# Patient Record
Sex: Female | Born: 1960 | Race: White | Hispanic: No | Marital: Married | Smoking: Never smoker
Health system: Southern US, Community
[De-identification: ages and names within clinical notes are randomized; demographics above are authoritative.]

## PROBLEM LIST (undated history)

## (undated) DIAGNOSIS — Z8041 Family history of malignant neoplasm of ovary: Secondary | ICD-10-CM

## (undated) DIAGNOSIS — T753XXA Motion sickness, initial encounter: Secondary | ICD-10-CM

## (undated) DIAGNOSIS — Z8669 Personal history of other diseases of the nervous system and sense organs: Secondary | ICD-10-CM

## (undated) DIAGNOSIS — N952 Postmenopausal atrophic vaginitis: Secondary | ICD-10-CM

## (undated) DIAGNOSIS — J45909 Unspecified asthma, uncomplicated: Secondary | ICD-10-CM

## (undated) DIAGNOSIS — N951 Menopausal and female climacteric states: Secondary | ICD-10-CM

## (undated) DIAGNOSIS — M1990 Unspecified osteoarthritis, unspecified site: Secondary | ICD-10-CM

## (undated) DIAGNOSIS — G43909 Migraine, unspecified, not intractable, without status migrainosus: Secondary | ICD-10-CM

## (undated) DIAGNOSIS — Z85828 Personal history of other malignant neoplasm of skin: Secondary | ICD-10-CM

## (undated) DIAGNOSIS — Z8489 Family history of other specified conditions: Secondary | ICD-10-CM

## (undated) DIAGNOSIS — G4700 Insomnia, unspecified: Secondary | ICD-10-CM

## (undated) HISTORY — DX: Family history of malignant neoplasm of ovary: Z80.41

## (undated) HISTORY — DX: Family history of other specified conditions: Z84.89

## (undated) HISTORY — DX: Insomnia, unspecified: G47.00

## (undated) HISTORY — DX: Postmenopausal atrophic vaginitis: N95.2

## (undated) HISTORY — DX: Menopausal and female climacteric states: N95.1

## (undated) HISTORY — DX: Personal history of other diseases of the nervous system and sense organs: Z86.69

---
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
## 2009-11-16 ENCOUNTER — Encounter: Admission: RE | Admit: 2009-11-16 | Discharge: 2009-11-16 | Payer: Self-pay | Admitting: Orthopedic Surgery

## 2009-12-09 ENCOUNTER — Encounter: Admission: RE | Admit: 2009-12-09 | Discharge: 2009-12-09 | Payer: Self-pay | Admitting: Orthopedic Surgery

## 2010-10-13 ENCOUNTER — Ambulatory Visit
Admission: RE | Admit: 2010-10-13 | Discharge: 2010-10-13 | Disposition: A | Discharge status: Home / Self Care | Payer: BC Managed Care – PPO | Source: Ambulatory Visit | Attending: Orthopedic Surgery | Admitting: Orthopedic Surgery

## 2010-10-13 ENCOUNTER — Other Ambulatory Visit: Payer: Self-pay | Admitting: Orthopedic Surgery

## 2010-10-13 DIAGNOSIS — M545 Low back pain, unspecified: Secondary | ICD-10-CM

## 2010-10-13 MED ORDER — METHYLPREDNISOLONE ACETATE 40 MG/ML INJ SUSP (RADIOLOG
120.0000 mg | Freq: Once | INTRAMUSCULAR | Status: AC
  Administered 2010-10-13: 120 mg via EPIDURAL

## 2010-10-13 MED ORDER — IOHEXOL 180 MG/ML  SOLN
1.0000 mL | Freq: Once | INTRAMUSCULAR | Status: AC | PRN
  Administered 2010-10-13: 1 mL via EPIDURAL

## 2010-11-27 ENCOUNTER — Other Ambulatory Visit: Payer: Self-pay | Admitting: Orthopedic Surgery

## 2010-11-27 DIAGNOSIS — M549 Dorsalgia, unspecified: Secondary | ICD-10-CM

## 2010-11-28 ENCOUNTER — Ambulatory Visit
Admission: RE | Admit: 2010-11-28 | Discharge: 2010-11-28 | Disposition: A | Discharge status: Home / Self Care | Payer: BC Managed Care – PPO | Source: Ambulatory Visit | Attending: Orthopedic Surgery | Admitting: Orthopedic Surgery

## 2010-11-28 DIAGNOSIS — M549 Dorsalgia, unspecified: Secondary | ICD-10-CM

## 2010-11-28 MED ORDER — IOHEXOL 180 MG/ML  SOLN
1.0000 mL | Freq: Once | INTRAMUSCULAR | Status: AC | PRN
  Administered 2010-11-28: 1 mL via EPIDURAL

## 2010-11-28 MED ORDER — METHYLPREDNISOLONE ACETATE 40 MG/ML INJ SUSP (RADIOLOG
120.0000 mg | Freq: Once | INTRAMUSCULAR | Status: AC
  Administered 2010-11-28: 120 mg via EPIDURAL

## 2011-04-03 ENCOUNTER — Other Ambulatory Visit: Payer: Self-pay | Admitting: Orthopedic Surgery

## 2011-04-03 DIAGNOSIS — M549 Dorsalgia, unspecified: Secondary | ICD-10-CM

## 2011-04-06 ENCOUNTER — Ambulatory Visit
Admission: RE | Admit: 2011-04-06 | Discharge: 2011-04-06 | Disposition: A | Discharge status: Home / Self Care | Payer: BC Managed Care – PPO | Source: Ambulatory Visit | Attending: Orthopedic Surgery | Admitting: Orthopedic Surgery

## 2011-04-06 DIAGNOSIS — M549 Dorsalgia, unspecified: Secondary | ICD-10-CM

## 2011-04-06 MED ORDER — IOHEXOL 180 MG/ML  SOLN
1.0000 mL | Freq: Once | INTRAMUSCULAR | Status: AC | PRN
  Administered 2011-04-06: 1 mL via EPIDURAL

## 2011-04-06 MED ORDER — METHYLPREDNISOLONE ACETATE 40 MG/ML INJ SUSP (RADIOLOG
120.0000 mg | Freq: Once | INTRAMUSCULAR | Status: AC
  Administered 2011-04-06: 120 mg via EPIDURAL

## 2013-12-09 ENCOUNTER — Other Ambulatory Visit: Payer: Self-pay | Admitting: Orthopedic Surgery

## 2013-12-09 DIAGNOSIS — M48061 Spinal stenosis, lumbar region without neurogenic claudication: Secondary | ICD-10-CM

## 2013-12-09 DIAGNOSIS — M79605 Pain in left leg: Secondary | ICD-10-CM

## 2013-12-09 DIAGNOSIS — M5126 Other intervertebral disc displacement, lumbar region: Secondary | ICD-10-CM

## 2013-12-18 ENCOUNTER — Ambulatory Visit
Admission: RE | Admit: 2013-12-18 | Discharge: 2013-12-18 | Disposition: A | Discharge status: Home / Self Care | Payer: BC Managed Care – PPO | Source: Ambulatory Visit | Attending: Orthopedic Surgery | Admitting: Orthopedic Surgery

## 2013-12-18 DIAGNOSIS — M48061 Spinal stenosis, lumbar region without neurogenic claudication: Secondary | ICD-10-CM

## 2013-12-18 DIAGNOSIS — M5126 Other intervertebral disc displacement, lumbar region: Secondary | ICD-10-CM

## 2013-12-18 DIAGNOSIS — M79605 Pain in left leg: Secondary | ICD-10-CM

## 2013-12-18 MED ORDER — IOHEXOL 180 MG/ML  SOLN
1.0000 mL | Freq: Once | INTRAMUSCULAR | Status: AC | PRN
  Administered 2013-12-18: 1 mL via EPIDURAL

## 2013-12-18 MED ORDER — METHYLPREDNISOLONE ACETATE 40 MG/ML INJ SUSP (RADIOLOG
120.0000 mg | Freq: Once | INTRAMUSCULAR | Status: AC
  Administered 2013-12-18: 120 mg via EPIDURAL

## 2013-12-18 NOTE — Discharge Instructions (Signed)

## 2014-01-08 HISTORY — PX: BACK SURGERY: SHX140

## 2014-03-01 ENCOUNTER — Other Ambulatory Visit: Payer: Self-pay | Admitting: Orthopedic Surgery

## 2014-03-01 DIAGNOSIS — M79605 Pain in left leg: Secondary | ICD-10-CM

## 2014-03-01 DIAGNOSIS — M5126 Other intervertebral disc displacement, lumbar region: Secondary | ICD-10-CM

## 2014-03-05 ENCOUNTER — Ambulatory Visit
Admission: RE | Admit: 2014-03-05 | Discharge: 2014-03-05 | Disposition: A | Discharge status: Home / Self Care | Payer: 59 | Source: Ambulatory Visit | Attending: Orthopedic Surgery | Admitting: Orthopedic Surgery

## 2014-03-05 DIAGNOSIS — M5126 Other intervertebral disc displacement, lumbar region: Secondary | ICD-10-CM

## 2014-03-05 DIAGNOSIS — M79605 Pain in left leg: Secondary | ICD-10-CM

## 2014-03-05 MED ORDER — IOHEXOL 180 MG/ML  SOLN
1.0000 mL | Freq: Once | INTRAMUSCULAR | Status: AC | PRN
  Administered 2014-03-05: 1 mL via EPIDURAL

## 2014-03-05 MED ORDER — METHYLPREDNISOLONE ACETATE 40 MG/ML INJ SUSP (RADIOLOG
120.0000 mg | Freq: Once | INTRAMUSCULAR | Status: AC
  Administered 2014-03-05: 120 mg via EPIDURAL

## 2014-03-17 ENCOUNTER — Inpatient Hospital Stay: Payer: Self-pay | Admitting: Surgery

## 2014-03-17 ENCOUNTER — Ambulatory Visit: Payer: Self-pay | Admitting: Physician Assistant

## 2014-04-19 ENCOUNTER — Other Ambulatory Visit: Payer: Self-pay | Admitting: Orthopedic Surgery

## 2014-04-19 DIAGNOSIS — M5126 Other intervertebral disc displacement, lumbar region: Secondary | ICD-10-CM

## 2014-04-27 ENCOUNTER — Ambulatory Visit
Admission: RE | Admit: 2014-04-27 | Discharge: 2014-04-27 | Disposition: A | Discharge status: Home / Self Care | Payer: 59 | Source: Ambulatory Visit | Attending: Orthopedic Surgery | Admitting: Orthopedic Surgery

## 2014-04-27 DIAGNOSIS — M5126 Other intervertebral disc displacement, lumbar region: Secondary | ICD-10-CM

## 2014-04-27 MED ORDER — IOHEXOL 180 MG/ML  SOLN
1.0000 mL | Freq: Once | INTRAMUSCULAR | Status: AC | PRN
  Administered 2014-04-27: 1 mL via EPIDURAL

## 2014-04-27 MED ORDER — METHYLPREDNISOLONE ACETATE 40 MG/ML INJ SUSP (RADIOLOG
120.0000 mg | Freq: Once | INTRAMUSCULAR | Status: AC
  Administered 2014-04-27: 120 mg via EPIDURAL

## 2014-04-30 ENCOUNTER — Ambulatory Visit
Admit: 2014-04-30 | Disposition: A | Discharge status: Home / Self Care | Payer: Self-pay | Attending: Gastroenterology | Admitting: Gastroenterology

## 2014-05-09 NOTE — Discharge Summary (Signed)
PATIENT NAME:  Michelle Mccarthy, Michelle Mccarthy MR#:  [id number] DATE OF BIRTH:  03-25-1960  DATE OF ADMISSION:  03/17/2014 DATE OF DISCHARGE:  03/19/2014  DISCHARGE DIAGNOSES:  1.  Acute diverticulitis.  2.  Allergic rhinitis.  3.  Degenerative disk disease.  4.  History of migraines.   DISCHARGE MEDICATIONS AS FOLLOWS: 1.  Allegra 180 p.o. daily. 2.  10 mg p.o. daily. 3.  Baclofen 10 mg p.o. t.i.d. p.r.n. 4.  Relpax 20 mg daily p.r.n. 5.  Metronidazole 500 mg p.o. q. 8 hours.  6.  Cipro 500 mg p.o. q. 12 hours. 7.  Toradol 10 mg q.i.d.   INDICATION FOR ADMISSION:  [name] is a pleasant, 54 year old female who presents with lower abdominal pain, leukocytosis, and a CT scan concerning for diverticulitis. She was admitted for management of diverticulitis.   HOSPITAL COURSE AS FOLLOWS:  [name] was admitted from clinic by Dr. Pat Patrick who examined her earlier in the day and with the above-mentioned findings she was given liquids initially on the ninth. She is also given IV pain meds and IV antibiotics leukocytosis stated that her pain is slightly improved on the 9th.  She was also IV pain medications and IV antibiotics.  Her leukocytosis stayed, but her pain was slightly improved on the 10th and on the 11th her pain was resolved. She was advanced to a regular diet and p.o. antibiotics, and she was tolerating those at time of discharge.   DISCHARGE INSTRUCTIONS ARE AS FOLLOWS: [name] is to follow up with [place] as an outpatient. She is to call or return to the ED if has increased pain, nausea, vomiting, or any diarrheal signs. She is to have a colonoscopy in approximately 6 weeks as per instructions.     ____________________________ Glena Norfolk Tarrance Januszewski, MD cal:sp D: 03/30/2014 23:30:14 ET T: 03/31/2014 09:53:25 ET JOB#: [id number]  cc: Harrell Gave A. Karson Chicas, MD, <Dictator> Floyde Parkins MD ELECTRONICALLY SIGNED 04/01/2014 19:45

## 2014-06-18 ENCOUNTER — Other Ambulatory Visit: Payer: Self-pay | Admitting: Orthopedic Surgery

## 2014-06-18 DIAGNOSIS — M5126 Other intervertebral disc displacement, lumbar region: Secondary | ICD-10-CM

## 2014-06-18 DIAGNOSIS — M48061 Spinal stenosis, lumbar region without neurogenic claudication: Secondary | ICD-10-CM

## 2014-06-18 DIAGNOSIS — M79605 Pain in left leg: Secondary | ICD-10-CM

## 2014-06-23 ENCOUNTER — Ambulatory Visit
Admission: RE | Admit: 2014-06-23 | Discharge: 2014-06-23 | Disposition: A | Discharge status: Home / Self Care | Payer: 59 | Source: Ambulatory Visit | Attending: Orthopedic Surgery | Admitting: Orthopedic Surgery

## 2014-06-23 DIAGNOSIS — M79605 Pain in left leg: Secondary | ICD-10-CM

## 2014-06-23 DIAGNOSIS — M48061 Spinal stenosis, lumbar region without neurogenic claudication: Secondary | ICD-10-CM

## 2014-06-23 DIAGNOSIS — M5126 Other intervertebral disc displacement, lumbar region: Secondary | ICD-10-CM

## 2014-06-23 MED ORDER — METHYLPREDNISOLONE ACETATE 40 MG/ML INJ SUSP (RADIOLOG
120.0000 mg | Freq: Once | INTRAMUSCULAR | Status: AC
  Administered 2014-06-23: 120 mg via EPIDURAL

## 2014-06-23 MED ORDER — IOHEXOL 180 MG/ML  SOLN
1.0000 mL | Freq: Once | INTRAMUSCULAR | Status: AC | PRN
  Administered 2014-06-23: 1 mL via EPIDURAL

## 2014-08-18 ENCOUNTER — Other Ambulatory Visit: Payer: Self-pay | Admitting: Orthopedic Surgery

## 2014-08-18 DIAGNOSIS — M5416 Radiculopathy, lumbar region: Secondary | ICD-10-CM

## 2014-08-26 ENCOUNTER — Ambulatory Visit
Admission: RE | Admit: 2014-08-26 | Discharge: 2014-08-26 | Disposition: A | Discharge status: Home / Self Care | Payer: 59 | Source: Ambulatory Visit | Attending: Orthopedic Surgery | Admitting: Orthopedic Surgery

## 2014-08-26 ENCOUNTER — Other Ambulatory Visit: Payer: Self-pay | Admitting: Orthopedic Surgery

## 2014-08-26 DIAGNOSIS — M5416 Radiculopathy, lumbar region: Secondary | ICD-10-CM

## 2014-08-26 MED ORDER — METHYLPREDNISOLONE ACETATE 40 MG/ML INJ SUSP (RADIOLOG
120.0000 mg | Freq: Once | INTRAMUSCULAR | Status: AC
  Administered 2014-08-26: 120 mg via EPIDURAL

## 2014-08-26 MED ORDER — IOHEXOL 180 MG/ML  SOLN
1.0000 mL | Freq: Once | INTRAMUSCULAR | Status: DC | PRN
  Administered 2014-08-26: 1 mL via EPIDURAL

## 2014-10-19 ENCOUNTER — Other Ambulatory Visit: Payer: Self-pay | Admitting: Orthopedic Surgery

## 2014-10-19 DIAGNOSIS — IMO0002 Reserved for concepts with insufficient information to code with codable children: Secondary | ICD-10-CM

## 2014-10-19 DIAGNOSIS — M79605 Pain in left leg: Secondary | ICD-10-CM

## 2014-10-21 ENCOUNTER — Ambulatory Visit
Admission: RE | Admit: 2014-10-21 | Discharge: 2014-10-21 | Disposition: A | Discharge status: Home / Self Care | Payer: 59 | Source: Ambulatory Visit | Attending: Orthopedic Surgery | Admitting: Orthopedic Surgery

## 2014-10-21 ENCOUNTER — Other Ambulatory Visit: Payer: Self-pay | Admitting: Orthopedic Surgery

## 2014-10-21 DIAGNOSIS — IMO0002 Reserved for concepts with insufficient information to code with codable children: Secondary | ICD-10-CM

## 2014-10-21 DIAGNOSIS — M79605 Pain in left leg: Secondary | ICD-10-CM

## 2014-10-21 MED ORDER — IOHEXOL 180 MG/ML  SOLN
1.0000 mL | Freq: Once | INTRAMUSCULAR | Status: DC | PRN
  Administered 2014-10-21: 1 mL via EPIDURAL

## 2014-10-21 MED ORDER — METHYLPREDNISOLONE ACETATE 40 MG/ML INJ SUSP (RADIOLOG
120.0000 mg | Freq: Once | INTRAMUSCULAR | Status: AC
  Administered 2014-10-21: 120 mg via EPIDURAL

## 2015-01-28 ENCOUNTER — Other Ambulatory Visit: Payer: Self-pay | Admitting: Neurosurgery

## 2015-01-28 DIAGNOSIS — M5416 Radiculopathy, lumbar region: Secondary | ICD-10-CM

## 2015-02-01 ENCOUNTER — Ambulatory Visit
Admission: RE | Admit: 2015-02-01 | Discharge: 2015-02-01 | Disposition: A | Discharge status: Home / Self Care | Payer: 59 | Source: Ambulatory Visit | Attending: Neurosurgery | Admitting: Neurosurgery

## 2015-02-01 DIAGNOSIS — M5416 Radiculopathy, lumbar region: Secondary | ICD-10-CM

## 2015-02-01 MED ORDER — GADOBENATE DIMEGLUMINE 529 MG/ML IV SOLN
16.0000 mL | Freq: Once | INTRAVENOUS | Status: AC | PRN
  Administered 2015-02-01: 16 mL via INTRAVENOUS

## 2015-02-02 ENCOUNTER — Other Ambulatory Visit: Payer: Self-pay

## 2015-02-03 ENCOUNTER — Other Ambulatory Visit: Payer: Self-pay | Admitting: Neurosurgery

## 2015-02-03 DIAGNOSIS — M5137 Other intervertebral disc degeneration, lumbosacral region: Secondary | ICD-10-CM

## 2015-02-10 ENCOUNTER — Other Ambulatory Visit: Payer: Self-pay

## 2015-02-11 ENCOUNTER — Ambulatory Visit
Admission: RE | Admit: 2015-02-11 | Discharge: 2015-02-11 | Disposition: A | Discharge status: Home / Self Care | Payer: 59 | Source: Ambulatory Visit | Attending: Neurosurgery | Admitting: Neurosurgery

## 2015-02-11 DIAGNOSIS — M5137 Other intervertebral disc degeneration, lumbosacral region: Secondary | ICD-10-CM

## 2015-02-11 MED ORDER — METHYLPREDNISOLONE ACETATE 40 MG/ML INJ SUSP (RADIOLOG
120.0000 mg | Freq: Once | INTRAMUSCULAR | Status: AC
  Administered 2015-02-11: 120 mg via INTRA_ARTICULAR

## 2015-02-11 MED ORDER — IOHEXOL 180 MG/ML  SOLN
1.0000 mL | Freq: Once | INTRAMUSCULAR | Status: AC | PRN
  Administered 2015-02-11: 1 mL via INTRA_ARTICULAR

## 2015-04-25 ENCOUNTER — Other Ambulatory Visit: Payer: Self-pay | Admitting: Neurosurgery

## 2015-04-25 DIAGNOSIS — M5137 Other intervertebral disc degeneration, lumbosacral region: Secondary | ICD-10-CM

## 2015-04-25 DIAGNOSIS — M5126 Other intervertebral disc displacement, lumbar region: Secondary | ICD-10-CM

## 2015-04-29 ENCOUNTER — Ambulatory Visit
Admission: RE | Admit: 2015-04-29 | Discharge: 2015-04-29 | Disposition: A | Discharge status: Home / Self Care | Payer: 59 | Source: Ambulatory Visit | Attending: Neurosurgery | Admitting: Neurosurgery

## 2015-04-29 DIAGNOSIS — M5137 Other intervertebral disc degeneration, lumbosacral region: Secondary | ICD-10-CM

## 2015-04-29 DIAGNOSIS — M5126 Other intervertebral disc displacement, lumbar region: Secondary | ICD-10-CM

## 2015-04-29 MED ORDER — METHYLPREDNISOLONE ACETATE 40 MG/ML INJ SUSP (RADIOLOG
120.0000 mg | Freq: Once | INTRAMUSCULAR | Status: AC
  Administered 2015-04-29: 120 mg via EPIDURAL

## 2015-04-29 MED ORDER — IOHEXOL 180 MG/ML  SOLN
1.0000 mL | Freq: Once | INTRAMUSCULAR | Status: AC | PRN
Start: 2015-04-29 — End: 2015-04-29
  Administered 2015-04-29: 1 mL via EPIDURAL

## 2015-06-09 ENCOUNTER — Other Ambulatory Visit: Payer: Self-pay | Admitting: Neurosurgery

## 2015-06-09 DIAGNOSIS — M5137 Other intervertebral disc degeneration, lumbosacral region: Secondary | ICD-10-CM

## 2015-06-24 ENCOUNTER — Ambulatory Visit
Admission: RE | Admit: 2015-06-24 | Discharge: 2015-06-24 | Disposition: A | Discharge status: Home / Self Care | Payer: 59 | Source: Ambulatory Visit | Attending: Neurosurgery | Admitting: Neurosurgery

## 2015-06-24 DIAGNOSIS — M5137 Other intervertebral disc degeneration, lumbosacral region: Secondary | ICD-10-CM

## 2015-06-24 MED ORDER — IOPAMIDOL (ISOVUE-M 200) INJECTION 41%
1.0000 mL | Freq: Once | INTRAMUSCULAR | Status: AC
  Administered 2015-06-24: 1 mL via EPIDURAL

## 2015-06-24 MED ORDER — METHYLPREDNISOLONE ACETATE 40 MG/ML INJ SUSP (RADIOLOG
120.0000 mg | Freq: Once | INTRAMUSCULAR | Status: AC
Start: 2015-06-24 — End: 2015-06-24
  Administered 2015-06-24: 120 mg via EPIDURAL

## 2015-08-19 ENCOUNTER — Other Ambulatory Visit: Payer: Self-pay | Admitting: Neurosurgery

## 2015-08-19 DIAGNOSIS — M5126 Other intervertebral disc displacement, lumbar region: Secondary | ICD-10-CM

## 2015-09-08 ENCOUNTER — Ambulatory Visit
Admission: RE | Admit: 2015-09-08 | Discharge: 2015-09-08 | Disposition: A | Discharge status: Home / Self Care | Payer: 59 | Source: Ambulatory Visit | Attending: Neurosurgery | Admitting: Neurosurgery

## 2015-09-08 DIAGNOSIS — M5126 Other intervertebral disc displacement, lumbar region: Secondary | ICD-10-CM

## 2015-09-08 MED ORDER — METHYLPREDNISOLONE ACETATE 40 MG/ML INJ SUSP (RADIOLOG
120.0000 mg | Freq: Once | INTRAMUSCULAR | Status: AC
  Administered 2015-09-08: 120 mg via EPIDURAL

## 2015-09-08 MED ORDER — IOPAMIDOL (ISOVUE-M 200) INJECTION 41%
1.0000 mL | Freq: Once | INTRAMUSCULAR | Status: AC
Start: 2015-09-08 — End: 2015-09-08
  Administered 2015-09-08: 1 mL via EPIDURAL

## 2015-11-16 ENCOUNTER — Other Ambulatory Visit: Payer: Self-pay | Admitting: Obstetrics & Gynecology

## 2015-11-16 DIAGNOSIS — Z1231 Encounter for screening mammogram for malignant neoplasm of breast: Secondary | ICD-10-CM

## 2015-11-17 ENCOUNTER — Other Ambulatory Visit: Payer: Self-pay | Admitting: Neurosurgery

## 2015-11-17 DIAGNOSIS — M5126 Other intervertebral disc displacement, lumbar region: Secondary | ICD-10-CM

## 2015-11-28 ENCOUNTER — Ambulatory Visit
Admission: RE | Admit: 2015-11-28 | Discharge: 2015-11-28 | Disposition: A | Discharge status: Home / Self Care | Payer: 59 | Source: Ambulatory Visit | Attending: Neurosurgery | Admitting: Neurosurgery

## 2015-11-28 DIAGNOSIS — M5126 Other intervertebral disc displacement, lumbar region: Secondary | ICD-10-CM

## 2015-11-28 MED ORDER — IOPAMIDOL (ISOVUE-M 200) INJECTION 41%
1.0000 mL | Freq: Once | INTRAMUSCULAR | Status: AC
  Administered 2015-11-28: 1 mL via EPIDURAL

## 2015-11-28 MED ORDER — METHYLPREDNISOLONE ACETATE 40 MG/ML INJ SUSP (RADIOLOG
120.0000 mg | Freq: Once | INTRAMUSCULAR | Status: AC
  Administered 2015-11-28: 120 mg via EPIDURAL

## 2015-12-23 ENCOUNTER — Ambulatory Visit
Admission: RE | Admit: 2015-12-23 | Discharge: 2015-12-23 | Disposition: A | Discharge status: Home / Self Care | Payer: 59 | Source: Ambulatory Visit | Attending: Obstetrics & Gynecology | Admitting: Obstetrics & Gynecology

## 2015-12-23 DIAGNOSIS — Z1231 Encounter for screening mammogram for malignant neoplasm of breast: Secondary | ICD-10-CM | POA: Diagnosis present

## 2016-01-06 ENCOUNTER — Ambulatory Visit
Admission: RE | Admit: 2016-01-06 | Discharge: 2016-01-06 | Disposition: A | Discharge status: Home / Self Care | Payer: 59 | Source: Ambulatory Visit | Attending: Allergy and Immunology | Admitting: Allergy and Immunology

## 2016-01-06 ENCOUNTER — Other Ambulatory Visit: Payer: Self-pay | Admitting: Allergy and Immunology

## 2016-01-06 DIAGNOSIS — R059 Cough, unspecified: Secondary | ICD-10-CM

## 2016-01-06 DIAGNOSIS — R05 Cough: Secondary | ICD-10-CM

## 2016-01-12 DIAGNOSIS — J3081 Allergic rhinitis due to animal (cat) (dog) hair and dander: Secondary | ICD-10-CM | POA: Diagnosis not present

## 2016-01-12 DIAGNOSIS — J3089 Other allergic rhinitis: Secondary | ICD-10-CM | POA: Diagnosis not present

## 2016-01-12 DIAGNOSIS — J301 Allergic rhinitis due to pollen: Secondary | ICD-10-CM | POA: Diagnosis not present

## 2016-01-20 DIAGNOSIS — J3089 Other allergic rhinitis: Secondary | ICD-10-CM | POA: Diagnosis not present

## 2016-01-20 DIAGNOSIS — J301 Allergic rhinitis due to pollen: Secondary | ICD-10-CM | POA: Diagnosis not present

## 2016-01-20 DIAGNOSIS — J3081 Allergic rhinitis due to animal (cat) (dog) hair and dander: Secondary | ICD-10-CM | POA: Diagnosis not present

## 2016-02-08 DIAGNOSIS — G43719 Chronic migraine without aura, intractable, without status migrainosus: Secondary | ICD-10-CM | POA: Diagnosis not present

## 2016-02-09 ENCOUNTER — Other Ambulatory Visit: Payer: Self-pay | Admitting: Neurosurgery

## 2016-02-09 DIAGNOSIS — M5126 Other intervertebral disc displacement, lumbar region: Secondary | ICD-10-CM

## 2016-02-15 ENCOUNTER — Ambulatory Visit
Admission: RE | Admit: 2016-02-15 | Discharge: 2016-02-15 | Disposition: A | Discharge status: Home / Self Care | Payer: 59 | Source: Ambulatory Visit | Attending: Neurosurgery | Admitting: Neurosurgery

## 2016-02-15 ENCOUNTER — Other Ambulatory Visit: Payer: Self-pay | Admitting: Neurosurgery

## 2016-02-15 DIAGNOSIS — M5126 Other intervertebral disc displacement, lumbar region: Secondary | ICD-10-CM

## 2016-02-15 DIAGNOSIS — M47817 Spondylosis without myelopathy or radiculopathy, lumbosacral region: Secondary | ICD-10-CM | POA: Diagnosis not present

## 2016-02-15 MED ORDER — METHYLPREDNISOLONE ACETATE 40 MG/ML INJ SUSP (RADIOLOG
120.0000 mg | Freq: Once | INTRAMUSCULAR | Status: AC
  Administered 2016-02-15: 120 mg via EPIDURAL

## 2016-02-15 MED ORDER — IOPAMIDOL (ISOVUE-M 200) INJECTION 41%
1.0000 mL | Freq: Once | INTRAMUSCULAR | Status: AC
  Administered 2016-02-15: 1 mL via EPIDURAL

## 2016-02-15 NOTE — Discharge Instructions (Signed)

## 2016-02-20 DIAGNOSIS — J301 Allergic rhinitis due to pollen: Secondary | ICD-10-CM | POA: Diagnosis not present

## 2016-02-20 DIAGNOSIS — J209 Acute bronchitis, unspecified: Secondary | ICD-10-CM | POA: Diagnosis not present

## 2016-02-20 DIAGNOSIS — J4530 Mild persistent asthma, uncomplicated: Secondary | ICD-10-CM | POA: Diagnosis not present

## 2016-02-20 DIAGNOSIS — J3089 Other allergic rhinitis: Secondary | ICD-10-CM | POA: Diagnosis not present

## 2016-03-02 DIAGNOSIS — J3081 Allergic rhinitis due to animal (cat) (dog) hair and dander: Secondary | ICD-10-CM | POA: Diagnosis not present

## 2016-03-02 DIAGNOSIS — J3089 Other allergic rhinitis: Secondary | ICD-10-CM | POA: Diagnosis not present

## 2016-03-02 DIAGNOSIS — J301 Allergic rhinitis due to pollen: Secondary | ICD-10-CM | POA: Diagnosis not present

## 2016-03-07 DIAGNOSIS — N951 Menopausal and female climacteric states: Secondary | ICD-10-CM | POA: Diagnosis not present

## 2016-03-07 DIAGNOSIS — G4700 Insomnia, unspecified: Secondary | ICD-10-CM | POA: Diagnosis not present

## 2016-03-08 DIAGNOSIS — J301 Allergic rhinitis due to pollen: Secondary | ICD-10-CM | POA: Diagnosis not present

## 2016-03-08 DIAGNOSIS — J3081 Allergic rhinitis due to animal (cat) (dog) hair and dander: Secondary | ICD-10-CM | POA: Diagnosis not present

## 2016-03-08 DIAGNOSIS — J3089 Other allergic rhinitis: Secondary | ICD-10-CM | POA: Diagnosis not present

## 2016-03-22 DIAGNOSIS — J301 Allergic rhinitis due to pollen: Secondary | ICD-10-CM | POA: Diagnosis not present

## 2016-03-22 DIAGNOSIS — J3081 Allergic rhinitis due to animal (cat) (dog) hair and dander: Secondary | ICD-10-CM | POA: Diagnosis not present

## 2016-03-22 DIAGNOSIS — J3089 Other allergic rhinitis: Secondary | ICD-10-CM | POA: Diagnosis not present

## 2016-03-30 DIAGNOSIS — J3089 Other allergic rhinitis: Secondary | ICD-10-CM | POA: Diagnosis not present

## 2016-03-30 DIAGNOSIS — J301 Allergic rhinitis due to pollen: Secondary | ICD-10-CM | POA: Diagnosis not present

## 2016-03-30 DIAGNOSIS — J3081 Allergic rhinitis due to animal (cat) (dog) hair and dander: Secondary | ICD-10-CM | POA: Diagnosis not present

## 2016-04-04 DIAGNOSIS — J3089 Other allergic rhinitis: Secondary | ICD-10-CM | POA: Diagnosis not present

## 2016-04-04 DIAGNOSIS — J3081 Allergic rhinitis due to animal (cat) (dog) hair and dander: Secondary | ICD-10-CM | POA: Diagnosis not present

## 2016-04-04 DIAGNOSIS — J301 Allergic rhinitis due to pollen: Secondary | ICD-10-CM | POA: Diagnosis not present

## 2016-04-09 DIAGNOSIS — J3089 Other allergic rhinitis: Secondary | ICD-10-CM | POA: Diagnosis not present

## 2016-04-09 DIAGNOSIS — J3081 Allergic rhinitis due to animal (cat) (dog) hair and dander: Secondary | ICD-10-CM | POA: Diagnosis not present

## 2016-04-09 DIAGNOSIS — J301 Allergic rhinitis due to pollen: Secondary | ICD-10-CM | POA: Diagnosis not present

## 2016-04-12 ENCOUNTER — Other Ambulatory Visit: Payer: Self-pay | Admitting: Neurosurgery

## 2016-04-12 DIAGNOSIS — M5126 Other intervertebral disc displacement, lumbar region: Secondary | ICD-10-CM

## 2016-04-19 ENCOUNTER — Ambulatory Visit
Admission: RE | Admit: 2016-04-19 | Discharge: 2016-04-19 | Disposition: A | Discharge status: Home / Self Care | Payer: 59 | Source: Ambulatory Visit | Attending: Neurosurgery | Admitting: Neurosurgery

## 2016-04-19 ENCOUNTER — Other Ambulatory Visit: Payer: Self-pay | Admitting: Neurosurgery

## 2016-04-19 DIAGNOSIS — M5126 Other intervertebral disc displacement, lumbar region: Secondary | ICD-10-CM

## 2016-04-19 DIAGNOSIS — M47817 Spondylosis without myelopathy or radiculopathy, lumbosacral region: Secondary | ICD-10-CM | POA: Diagnosis not present

## 2016-04-19 MED ORDER — METHYLPREDNISOLONE ACETATE 40 MG/ML INJ SUSP (RADIOLOG
120.0000 mg | Freq: Once | INTRAMUSCULAR | Status: AC
  Administered 2016-04-19: 120 mg via EPIDURAL

## 2016-04-19 MED ORDER — IOPAMIDOL (ISOVUE-M 200) INJECTION 41%
1.0000 mL | Freq: Once | INTRAMUSCULAR | Status: AC
  Administered 2016-04-19: 1 mL via EPIDURAL

## 2016-04-19 NOTE — Discharge Instructions (Signed)

## 2016-04-20 DIAGNOSIS — J3081 Allergic rhinitis due to animal (cat) (dog) hair and dander: Secondary | ICD-10-CM | POA: Diagnosis not present

## 2016-04-20 DIAGNOSIS — J3089 Other allergic rhinitis: Secondary | ICD-10-CM | POA: Diagnosis not present

## 2016-04-20 DIAGNOSIS — J301 Allergic rhinitis due to pollen: Secondary | ICD-10-CM | POA: Diagnosis not present

## 2016-04-23 DIAGNOSIS — J3089 Other allergic rhinitis: Secondary | ICD-10-CM | POA: Diagnosis not present

## 2016-04-23 DIAGNOSIS — J3081 Allergic rhinitis due to animal (cat) (dog) hair and dander: Secondary | ICD-10-CM | POA: Diagnosis not present

## 2016-04-23 DIAGNOSIS — J301 Allergic rhinitis due to pollen: Secondary | ICD-10-CM | POA: Diagnosis not present

## 2016-04-24 DIAGNOSIS — G43719 Chronic migraine without aura, intractable, without status migrainosus: Secondary | ICD-10-CM | POA: Diagnosis not present

## 2016-05-03 DIAGNOSIS — J3089 Other allergic rhinitis: Secondary | ICD-10-CM | POA: Diagnosis not present

## 2016-05-03 DIAGNOSIS — J3081 Allergic rhinitis due to animal (cat) (dog) hair and dander: Secondary | ICD-10-CM | POA: Diagnosis not present

## 2016-05-03 DIAGNOSIS — J301 Allergic rhinitis due to pollen: Secondary | ICD-10-CM | POA: Diagnosis not present

## 2016-05-03 DIAGNOSIS — G43719 Chronic migraine without aura, intractable, without status migrainosus: Secondary | ICD-10-CM | POA: Diagnosis not present

## 2016-05-15 DIAGNOSIS — J3081 Allergic rhinitis due to animal (cat) (dog) hair and dander: Secondary | ICD-10-CM | POA: Diagnosis not present

## 2016-05-15 DIAGNOSIS — J301 Allergic rhinitis due to pollen: Secondary | ICD-10-CM | POA: Diagnosis not present

## 2016-05-15 DIAGNOSIS — J3089 Other allergic rhinitis: Secondary | ICD-10-CM | POA: Diagnosis not present

## 2016-05-22 DIAGNOSIS — J301 Allergic rhinitis due to pollen: Secondary | ICD-10-CM | POA: Diagnosis not present

## 2016-05-22 DIAGNOSIS — J3081 Allergic rhinitis due to animal (cat) (dog) hair and dander: Secondary | ICD-10-CM | POA: Diagnosis not present

## 2016-05-22 DIAGNOSIS — J3089 Other allergic rhinitis: Secondary | ICD-10-CM | POA: Diagnosis not present

## 2016-05-24 DIAGNOSIS — J301 Allergic rhinitis due to pollen: Secondary | ICD-10-CM | POA: Diagnosis not present

## 2016-05-24 DIAGNOSIS — J3081 Allergic rhinitis due to animal (cat) (dog) hair and dander: Secondary | ICD-10-CM | POA: Diagnosis not present

## 2016-05-31 DIAGNOSIS — J301 Allergic rhinitis due to pollen: Secondary | ICD-10-CM | POA: Diagnosis not present

## 2016-05-31 DIAGNOSIS — J3089 Other allergic rhinitis: Secondary | ICD-10-CM | POA: Diagnosis not present

## 2016-05-31 DIAGNOSIS — J3081 Allergic rhinitis due to animal (cat) (dog) hair and dander: Secondary | ICD-10-CM | POA: Diagnosis not present

## 2016-06-07 DIAGNOSIS — J301 Allergic rhinitis due to pollen: Secondary | ICD-10-CM | POA: Diagnosis not present

## 2016-06-07 DIAGNOSIS — J3081 Allergic rhinitis due to animal (cat) (dog) hair and dander: Secondary | ICD-10-CM | POA: Diagnosis not present

## 2016-06-07 DIAGNOSIS — J3089 Other allergic rhinitis: Secondary | ICD-10-CM | POA: Diagnosis not present

## 2016-06-12 DIAGNOSIS — M5440 Lumbago with sciatica, unspecified side: Secondary | ICD-10-CM | POA: Diagnosis not present

## 2016-06-13 DIAGNOSIS — J3089 Other allergic rhinitis: Secondary | ICD-10-CM | POA: Diagnosis not present

## 2016-06-13 DIAGNOSIS — J3081 Allergic rhinitis due to animal (cat) (dog) hair and dander: Secondary | ICD-10-CM | POA: Diagnosis not present

## 2016-06-13 DIAGNOSIS — J301 Allergic rhinitis due to pollen: Secondary | ICD-10-CM | POA: Diagnosis not present

## 2016-06-21 ENCOUNTER — Other Ambulatory Visit: Payer: Self-pay | Admitting: Neurosurgery

## 2016-06-21 DIAGNOSIS — J3089 Other allergic rhinitis: Secondary | ICD-10-CM | POA: Diagnosis not present

## 2016-06-21 DIAGNOSIS — J3081 Allergic rhinitis due to animal (cat) (dog) hair and dander: Secondary | ICD-10-CM | POA: Diagnosis not present

## 2016-06-21 DIAGNOSIS — M5440 Lumbago with sciatica, unspecified side: Secondary | ICD-10-CM

## 2016-06-21 DIAGNOSIS — J301 Allergic rhinitis due to pollen: Secondary | ICD-10-CM | POA: Diagnosis not present

## 2016-06-27 DIAGNOSIS — J3089 Other allergic rhinitis: Secondary | ICD-10-CM | POA: Diagnosis not present

## 2016-06-27 DIAGNOSIS — J301 Allergic rhinitis due to pollen: Secondary | ICD-10-CM | POA: Diagnosis not present

## 2016-06-27 DIAGNOSIS — J3081 Allergic rhinitis due to animal (cat) (dog) hair and dander: Secondary | ICD-10-CM | POA: Diagnosis not present

## 2016-07-02 ENCOUNTER — Ambulatory Visit
Admission: RE | Admit: 2016-07-02 | Discharge: 2016-07-02 | Disposition: A | Discharge status: Home / Self Care | Payer: 59 | Source: Ambulatory Visit | Attending: Neurosurgery | Admitting: Neurosurgery

## 2016-07-02 DIAGNOSIS — M5440 Lumbago with sciatica, unspecified side: Secondary | ICD-10-CM

## 2016-07-02 DIAGNOSIS — M47817 Spondylosis without myelopathy or radiculopathy, lumbosacral region: Secondary | ICD-10-CM | POA: Diagnosis not present

## 2016-07-02 MED ORDER — IOPAMIDOL (ISOVUE-M 200) INJECTION 41%
1.0000 mL | Freq: Once | INTRAMUSCULAR | Status: AC
  Administered 2016-07-02: 1 mL via EPIDURAL

## 2016-07-02 MED ORDER — METHYLPREDNISOLONE ACETATE 40 MG/ML INJ SUSP (RADIOLOG
120.0000 mg | Freq: Once | INTRAMUSCULAR | Status: AC
  Administered 2016-07-02: 120 mg via EPIDURAL

## 2016-07-03 DIAGNOSIS — J3081 Allergic rhinitis due to animal (cat) (dog) hair and dander: Secondary | ICD-10-CM | POA: Diagnosis not present

## 2016-07-03 DIAGNOSIS — J301 Allergic rhinitis due to pollen: Secondary | ICD-10-CM | POA: Diagnosis not present

## 2016-07-03 DIAGNOSIS — J3089 Other allergic rhinitis: Secondary | ICD-10-CM | POA: Diagnosis not present

## 2016-07-10 DIAGNOSIS — J301 Allergic rhinitis due to pollen: Secondary | ICD-10-CM | POA: Diagnosis not present

## 2016-07-10 DIAGNOSIS — J019 Acute sinusitis, unspecified: Secondary | ICD-10-CM | POA: Diagnosis not present

## 2016-07-10 DIAGNOSIS — J3089 Other allergic rhinitis: Secondary | ICD-10-CM | POA: Diagnosis not present

## 2016-07-10 DIAGNOSIS — J4530 Mild persistent asthma, uncomplicated: Secondary | ICD-10-CM | POA: Diagnosis not present

## 2016-07-17 DIAGNOSIS — N952 Postmenopausal atrophic vaginitis: Secondary | ICD-10-CM | POA: Diagnosis not present

## 2016-07-17 DIAGNOSIS — N951 Menopausal and female climacteric states: Secondary | ICD-10-CM | POA: Diagnosis not present

## 2016-07-17 DIAGNOSIS — G4700 Insomnia, unspecified: Secondary | ICD-10-CM | POA: Diagnosis not present

## 2016-07-19 DIAGNOSIS — J3089 Other allergic rhinitis: Secondary | ICD-10-CM | POA: Diagnosis not present

## 2016-07-19 DIAGNOSIS — J3081 Allergic rhinitis due to animal (cat) (dog) hair and dander: Secondary | ICD-10-CM | POA: Diagnosis not present

## 2016-07-19 DIAGNOSIS — J301 Allergic rhinitis due to pollen: Secondary | ICD-10-CM | POA: Diagnosis not present

## 2016-08-14 DIAGNOSIS — G43719 Chronic migraine without aura, intractable, without status migrainosus: Secondary | ICD-10-CM | POA: Diagnosis not present

## 2016-08-16 DIAGNOSIS — J3081 Allergic rhinitis due to animal (cat) (dog) hair and dander: Secondary | ICD-10-CM | POA: Diagnosis not present

## 2016-08-16 DIAGNOSIS — J301 Allergic rhinitis due to pollen: Secondary | ICD-10-CM | POA: Diagnosis not present

## 2016-08-16 DIAGNOSIS — J3089 Other allergic rhinitis: Secondary | ICD-10-CM | POA: Diagnosis not present

## 2016-08-16 DIAGNOSIS — G43719 Chronic migraine without aura, intractable, without status migrainosus: Secondary | ICD-10-CM | POA: Diagnosis not present

## 2016-08-24 DIAGNOSIS — J3089 Other allergic rhinitis: Secondary | ICD-10-CM | POA: Diagnosis not present

## 2016-08-24 DIAGNOSIS — J3081 Allergic rhinitis due to animal (cat) (dog) hair and dander: Secondary | ICD-10-CM | POA: Diagnosis not present

## 2016-08-24 DIAGNOSIS — J301 Allergic rhinitis due to pollen: Secondary | ICD-10-CM | POA: Diagnosis not present

## 2016-08-28 DIAGNOSIS — Z79899 Other long term (current) drug therapy: Secondary | ICD-10-CM | POA: Diagnosis not present

## 2016-08-30 DIAGNOSIS — J3081 Allergic rhinitis due to animal (cat) (dog) hair and dander: Secondary | ICD-10-CM | POA: Diagnosis not present

## 2016-08-30 DIAGNOSIS — J301 Allergic rhinitis due to pollen: Secondary | ICD-10-CM | POA: Diagnosis not present

## 2016-08-30 DIAGNOSIS — J3089 Other allergic rhinitis: Secondary | ICD-10-CM | POA: Diagnosis not present

## 2016-09-04 DIAGNOSIS — J3081 Allergic rhinitis due to animal (cat) (dog) hair and dander: Secondary | ICD-10-CM | POA: Diagnosis not present

## 2016-09-04 DIAGNOSIS — J301 Allergic rhinitis due to pollen: Secondary | ICD-10-CM | POA: Diagnosis not present

## 2016-09-04 DIAGNOSIS — J3089 Other allergic rhinitis: Secondary | ICD-10-CM | POA: Diagnosis not present

## 2016-09-05 DIAGNOSIS — C44519 Basal cell carcinoma of skin of other part of trunk: Secondary | ICD-10-CM | POA: Diagnosis not present

## 2016-09-05 DIAGNOSIS — L820 Inflamed seborrheic keratosis: Secondary | ICD-10-CM | POA: Diagnosis not present

## 2016-09-05 DIAGNOSIS — C44619 Basal cell carcinoma of skin of left upper limb, including shoulder: Secondary | ICD-10-CM | POA: Diagnosis not present

## 2016-09-05 DIAGNOSIS — L578 Other skin changes due to chronic exposure to nonionizing radiation: Secondary | ICD-10-CM | POA: Diagnosis not present

## 2016-09-05 DIAGNOSIS — D485 Neoplasm of uncertain behavior of skin: Secondary | ICD-10-CM | POA: Diagnosis not present

## 2016-09-05 DIAGNOSIS — L821 Other seborrheic keratosis: Secondary | ICD-10-CM | POA: Diagnosis not present

## 2016-09-11 ENCOUNTER — Other Ambulatory Visit: Payer: Self-pay | Admitting: Student

## 2016-09-11 DIAGNOSIS — M5416 Radiculopathy, lumbar region: Secondary | ICD-10-CM

## 2016-09-13 DIAGNOSIS — J301 Allergic rhinitis due to pollen: Secondary | ICD-10-CM | POA: Diagnosis not present

## 2016-09-13 DIAGNOSIS — J3089 Other allergic rhinitis: Secondary | ICD-10-CM | POA: Diagnosis not present

## 2016-09-13 DIAGNOSIS — J3081 Allergic rhinitis due to animal (cat) (dog) hair and dander: Secondary | ICD-10-CM | POA: Diagnosis not present

## 2016-09-18 DIAGNOSIS — C44519 Basal cell carcinoma of skin of other part of trunk: Secondary | ICD-10-CM | POA: Diagnosis not present

## 2016-09-18 DIAGNOSIS — C44619 Basal cell carcinoma of skin of left upper limb, including shoulder: Secondary | ICD-10-CM | POA: Diagnosis not present

## 2016-09-18 DIAGNOSIS — L820 Inflamed seborrheic keratosis: Secondary | ICD-10-CM | POA: Diagnosis not present

## 2016-09-18 DIAGNOSIS — Z85828 Personal history of other malignant neoplasm of skin: Secondary | ICD-10-CM

## 2016-09-18 HISTORY — DX: Personal history of other malignant neoplasm of skin: Z85.828

## 2016-09-20 ENCOUNTER — Other Ambulatory Visit: Payer: Self-pay | Admitting: Student

## 2016-09-20 ENCOUNTER — Ambulatory Visit
Admission: RE | Admit: 2016-09-20 | Discharge: 2016-09-20 | Disposition: A | Discharge status: Home / Self Care | Payer: 59 | Source: Ambulatory Visit | Attending: Student | Admitting: Student

## 2016-09-20 DIAGNOSIS — M5416 Radiculopathy, lumbar region: Secondary | ICD-10-CM

## 2016-09-20 DIAGNOSIS — M47817 Spondylosis without myelopathy or radiculopathy, lumbosacral region: Secondary | ICD-10-CM | POA: Diagnosis not present

## 2016-09-20 DIAGNOSIS — M545 Low back pain: Secondary | ICD-10-CM | POA: Diagnosis not present

## 2016-09-20 MED ORDER — IOPAMIDOL (ISOVUE-M 200) INJECTION 41%
1.0000 mL | Freq: Once | INTRAMUSCULAR | Status: AC
  Administered 2016-09-20: 1 mL via EPIDURAL

## 2016-09-20 MED ORDER — METHYLPREDNISOLONE ACETATE 40 MG/ML INJ SUSP (RADIOLOG
120.0000 mg | Freq: Once | INTRAMUSCULAR | Status: AC
  Administered 2016-09-20: 120 mg via EPIDURAL

## 2016-09-25 DIAGNOSIS — C44519 Basal cell carcinoma of skin of other part of trunk: Secondary | ICD-10-CM | POA: Diagnosis not present

## 2016-10-02 DIAGNOSIS — J3089 Other allergic rhinitis: Secondary | ICD-10-CM | POA: Diagnosis not present

## 2016-10-02 DIAGNOSIS — J3081 Allergic rhinitis due to animal (cat) (dog) hair and dander: Secondary | ICD-10-CM | POA: Diagnosis not present

## 2016-10-02 DIAGNOSIS — J301 Allergic rhinitis due to pollen: Secondary | ICD-10-CM | POA: Diagnosis not present

## 2016-10-04 DIAGNOSIS — J3089 Other allergic rhinitis: Secondary | ICD-10-CM | POA: Diagnosis not present

## 2016-10-25 DIAGNOSIS — J3089 Other allergic rhinitis: Secondary | ICD-10-CM | POA: Diagnosis not present

## 2016-10-25 DIAGNOSIS — J3081 Allergic rhinitis due to animal (cat) (dog) hair and dander: Secondary | ICD-10-CM | POA: Diagnosis not present

## 2016-10-25 DIAGNOSIS — J301 Allergic rhinitis due to pollen: Secondary | ICD-10-CM | POA: Diagnosis not present

## 2016-11-02 DIAGNOSIS — J3089 Other allergic rhinitis: Secondary | ICD-10-CM | POA: Diagnosis not present

## 2016-11-02 DIAGNOSIS — J301 Allergic rhinitis due to pollen: Secondary | ICD-10-CM | POA: Diagnosis not present

## 2016-11-02 DIAGNOSIS — J3081 Allergic rhinitis due to animal (cat) (dog) hair and dander: Secondary | ICD-10-CM | POA: Diagnosis not present

## 2016-11-08 DIAGNOSIS — J301 Allergic rhinitis due to pollen: Secondary | ICD-10-CM | POA: Diagnosis not present

## 2016-11-08 DIAGNOSIS — J3089 Other allergic rhinitis: Secondary | ICD-10-CM | POA: Diagnosis not present

## 2016-11-08 DIAGNOSIS — J3081 Allergic rhinitis due to animal (cat) (dog) hair and dander: Secondary | ICD-10-CM | POA: Diagnosis not present

## 2016-11-14 ENCOUNTER — Other Ambulatory Visit: Payer: Self-pay | Admitting: Student

## 2016-11-14 DIAGNOSIS — M5416 Radiculopathy, lumbar region: Secondary | ICD-10-CM

## 2016-11-15 DIAGNOSIS — J3089 Other allergic rhinitis: Secondary | ICD-10-CM | POA: Diagnosis not present

## 2016-11-15 DIAGNOSIS — J301 Allergic rhinitis due to pollen: Secondary | ICD-10-CM | POA: Diagnosis not present

## 2016-11-15 DIAGNOSIS — J3081 Allergic rhinitis due to animal (cat) (dog) hair and dander: Secondary | ICD-10-CM | POA: Diagnosis not present

## 2016-11-19 DIAGNOSIS — G43719 Chronic migraine without aura, intractable, without status migrainosus: Secondary | ICD-10-CM | POA: Diagnosis not present

## 2016-11-20 DIAGNOSIS — N951 Menopausal and female climacteric states: Secondary | ICD-10-CM | POA: Diagnosis not present

## 2016-11-20 DIAGNOSIS — G4700 Insomnia, unspecified: Secondary | ICD-10-CM | POA: Diagnosis not present

## 2016-11-20 DIAGNOSIS — Z79899 Other long term (current) drug therapy: Secondary | ICD-10-CM | POA: Diagnosis not present

## 2016-11-20 DIAGNOSIS — J309 Allergic rhinitis, unspecified: Secondary | ICD-10-CM | POA: Diagnosis not present

## 2016-11-21 DIAGNOSIS — G43719 Chronic migraine without aura, intractable, without status migrainosus: Secondary | ICD-10-CM | POA: Diagnosis not present

## 2016-11-22 ENCOUNTER — Ambulatory Visit
Admission: RE | Admit: 2016-11-22 | Discharge: 2016-11-22 | Disposition: A | Discharge status: Home / Self Care | Payer: 59 | Source: Ambulatory Visit | Attending: Student | Admitting: Student

## 2016-11-22 DIAGNOSIS — J3081 Allergic rhinitis due to animal (cat) (dog) hair and dander: Secondary | ICD-10-CM | POA: Diagnosis not present

## 2016-11-22 DIAGNOSIS — M47817 Spondylosis without myelopathy or radiculopathy, lumbosacral region: Secondary | ICD-10-CM | POA: Diagnosis not present

## 2016-11-22 DIAGNOSIS — J3089 Other allergic rhinitis: Secondary | ICD-10-CM | POA: Diagnosis not present

## 2016-11-22 DIAGNOSIS — J301 Allergic rhinitis due to pollen: Secondary | ICD-10-CM | POA: Diagnosis not present

## 2016-11-22 DIAGNOSIS — M5416 Radiculopathy, lumbar region: Secondary | ICD-10-CM

## 2016-11-22 MED ORDER — METHYLPREDNISOLONE ACETATE 40 MG/ML INJ SUSP (RADIOLOG
120.0000 mg | Freq: Once | INTRAMUSCULAR | Status: AC
  Administered 2016-11-22: 120 mg via EPIDURAL

## 2016-11-22 MED ORDER — IOPAMIDOL (ISOVUE-M 200) INJECTION 41%
1.0000 mL | Freq: Once | INTRAMUSCULAR | Status: AC
  Administered 2016-11-22: 1 mL via EPIDURAL

## 2016-11-22 NOTE — Progress Notes (Signed)
Pt was delayed in leaving due to both feet and legs being numb.

## 2016-11-26 DIAGNOSIS — D485 Neoplasm of uncertain behavior of skin: Secondary | ICD-10-CM | POA: Diagnosis not present

## 2016-11-26 DIAGNOSIS — L820 Inflamed seborrheic keratosis: Secondary | ICD-10-CM | POA: Diagnosis not present

## 2016-11-26 DIAGNOSIS — Z1283 Encounter for screening for malignant neoplasm of skin: Secondary | ICD-10-CM | POA: Diagnosis not present

## 2016-11-26 DIAGNOSIS — D229 Melanocytic nevi, unspecified: Secondary | ICD-10-CM | POA: Diagnosis not present

## 2016-11-26 DIAGNOSIS — Z85828 Personal history of other malignant neoplasm of skin: Secondary | ICD-10-CM | POA: Diagnosis not present

## 2016-11-26 DIAGNOSIS — L905 Scar conditions and fibrosis of skin: Secondary | ICD-10-CM | POA: Diagnosis not present

## 2016-11-27 DIAGNOSIS — J3081 Allergic rhinitis due to animal (cat) (dog) hair and dander: Secondary | ICD-10-CM | POA: Diagnosis not present

## 2016-11-27 DIAGNOSIS — J301 Allergic rhinitis due to pollen: Secondary | ICD-10-CM | POA: Diagnosis not present

## 2016-11-27 DIAGNOSIS — J3089 Other allergic rhinitis: Secondary | ICD-10-CM | POA: Diagnosis not present

## 2016-12-04 DIAGNOSIS — G43719 Chronic migraine without aura, intractable, without status migrainosus: Secondary | ICD-10-CM | POA: Diagnosis not present

## 2016-12-06 DIAGNOSIS — J3081 Allergic rhinitis due to animal (cat) (dog) hair and dander: Secondary | ICD-10-CM | POA: Diagnosis not present

## 2016-12-06 DIAGNOSIS — J3089 Other allergic rhinitis: Secondary | ICD-10-CM | POA: Diagnosis not present

## 2016-12-06 DIAGNOSIS — J301 Allergic rhinitis due to pollen: Secondary | ICD-10-CM | POA: Diagnosis not present

## 2016-12-11 DIAGNOSIS — L03113 Cellulitis of right upper limb: Secondary | ICD-10-CM | POA: Diagnosis not present

## 2016-12-11 DIAGNOSIS — J3089 Other allergic rhinitis: Secondary | ICD-10-CM | POA: Diagnosis not present

## 2016-12-11 DIAGNOSIS — J4530 Mild persistent asthma, uncomplicated: Secondary | ICD-10-CM | POA: Diagnosis not present

## 2016-12-11 DIAGNOSIS — J301 Allergic rhinitis due to pollen: Secondary | ICD-10-CM | POA: Diagnosis not present

## 2016-12-11 DIAGNOSIS — W5503XA Scratched by cat, initial encounter: Secondary | ICD-10-CM | POA: Diagnosis not present

## 2016-12-21 DIAGNOSIS — J301 Allergic rhinitis due to pollen: Secondary | ICD-10-CM | POA: Diagnosis not present

## 2016-12-21 DIAGNOSIS — J3089 Other allergic rhinitis: Secondary | ICD-10-CM | POA: Diagnosis not present

## 2016-12-21 DIAGNOSIS — J3081 Allergic rhinitis due to animal (cat) (dog) hair and dander: Secondary | ICD-10-CM | POA: Diagnosis not present

## 2016-12-26 DIAGNOSIS — J301 Allergic rhinitis due to pollen: Secondary | ICD-10-CM | POA: Diagnosis not present

## 2016-12-26 DIAGNOSIS — J3089 Other allergic rhinitis: Secondary | ICD-10-CM | POA: Diagnosis not present

## 2016-12-26 DIAGNOSIS — J3081 Allergic rhinitis due to animal (cat) (dog) hair and dander: Secondary | ICD-10-CM | POA: Diagnosis not present

## 2017-01-07 DIAGNOSIS — J301 Allergic rhinitis due to pollen: Secondary | ICD-10-CM | POA: Diagnosis not present

## 2017-01-07 DIAGNOSIS — J3089 Other allergic rhinitis: Secondary | ICD-10-CM | POA: Diagnosis not present

## 2017-01-07 DIAGNOSIS — J3081 Allergic rhinitis due to animal (cat) (dog) hair and dander: Secondary | ICD-10-CM | POA: Diagnosis not present

## 2017-01-10 ENCOUNTER — Other Ambulatory Visit: Payer: Self-pay | Admitting: Student

## 2017-01-10 DIAGNOSIS — M5440 Lumbago with sciatica, unspecified side: Secondary | ICD-10-CM

## 2017-01-15 DIAGNOSIS — G43719 Chronic migraine without aura, intractable, without status migrainosus: Secondary | ICD-10-CM | POA: Diagnosis not present

## 2017-01-15 DIAGNOSIS — J301 Allergic rhinitis due to pollen: Secondary | ICD-10-CM | POA: Diagnosis not present

## 2017-01-15 DIAGNOSIS — J3089 Other allergic rhinitis: Secondary | ICD-10-CM | POA: Diagnosis not present

## 2017-01-15 DIAGNOSIS — J3081 Allergic rhinitis due to animal (cat) (dog) hair and dander: Secondary | ICD-10-CM | POA: Diagnosis not present

## 2017-01-24 DIAGNOSIS — J3089 Other allergic rhinitis: Secondary | ICD-10-CM | POA: Diagnosis not present

## 2017-01-24 DIAGNOSIS — J301 Allergic rhinitis due to pollen: Secondary | ICD-10-CM | POA: Diagnosis not present

## 2017-01-24 DIAGNOSIS — J3081 Allergic rhinitis due to animal (cat) (dog) hair and dander: Secondary | ICD-10-CM | POA: Diagnosis not present

## 2017-01-30 ENCOUNTER — Ambulatory Visit
Admission: RE | Admit: 2017-01-30 | Discharge: 2017-01-30 | Disposition: A | Discharge status: Home / Self Care | Payer: 59 | Source: Ambulatory Visit | Attending: Student | Admitting: Student

## 2017-01-30 DIAGNOSIS — M47817 Spondylosis without myelopathy or radiculopathy, lumbosacral region: Secondary | ICD-10-CM | POA: Diagnosis not present

## 2017-01-30 DIAGNOSIS — J301 Allergic rhinitis due to pollen: Secondary | ICD-10-CM | POA: Diagnosis not present

## 2017-01-30 DIAGNOSIS — J3081 Allergic rhinitis due to animal (cat) (dog) hair and dander: Secondary | ICD-10-CM | POA: Diagnosis not present

## 2017-01-30 DIAGNOSIS — M5440 Lumbago with sciatica, unspecified side: Secondary | ICD-10-CM

## 2017-01-30 DIAGNOSIS — J3089 Other allergic rhinitis: Secondary | ICD-10-CM | POA: Diagnosis not present

## 2017-01-30 MED ORDER — IOPAMIDOL (ISOVUE-M 200) INJECTION 41%
1.0000 mL | Freq: Once | INTRAMUSCULAR | Status: AC
  Administered 2017-01-30: 1 mL via EPIDURAL

## 2017-01-30 MED ORDER — METHYLPREDNISOLONE ACETATE 40 MG/ML INJ SUSP (RADIOLOG
120.0000 mg | Freq: Once | INTRAMUSCULAR | Status: AC
  Administered 2017-01-30: 120 mg via EPIDURAL

## 2017-02-14 DIAGNOSIS — J3089 Other allergic rhinitis: Secondary | ICD-10-CM | POA: Diagnosis not present

## 2017-02-14 DIAGNOSIS — J301 Allergic rhinitis due to pollen: Secondary | ICD-10-CM | POA: Diagnosis not present

## 2017-02-14 DIAGNOSIS — J3081 Allergic rhinitis due to animal (cat) (dog) hair and dander: Secondary | ICD-10-CM | POA: Diagnosis not present

## 2017-02-25 DIAGNOSIS — G43719 Chronic migraine without aura, intractable, without status migrainosus: Secondary | ICD-10-CM | POA: Diagnosis not present

## 2017-02-26 DIAGNOSIS — R05 Cough: Secondary | ICD-10-CM | POA: Diagnosis not present

## 2017-02-26 DIAGNOSIS — J301 Allergic rhinitis due to pollen: Secondary | ICD-10-CM | POA: Diagnosis not present

## 2017-02-26 DIAGNOSIS — J3089 Other allergic rhinitis: Secondary | ICD-10-CM | POA: Diagnosis not present

## 2017-02-26 DIAGNOSIS — J4530 Mild persistent asthma, uncomplicated: Secondary | ICD-10-CM | POA: Diagnosis not present

## 2017-03-06 DIAGNOSIS — J3081 Allergic rhinitis due to animal (cat) (dog) hair and dander: Secondary | ICD-10-CM | POA: Diagnosis not present

## 2017-03-06 DIAGNOSIS — J301 Allergic rhinitis due to pollen: Secondary | ICD-10-CM | POA: Diagnosis not present

## 2017-03-06 DIAGNOSIS — J3089 Other allergic rhinitis: Secondary | ICD-10-CM | POA: Diagnosis not present

## 2017-03-07 DIAGNOSIS — G43719 Chronic migraine without aura, intractable, without status migrainosus: Secondary | ICD-10-CM | POA: Diagnosis not present

## 2017-03-13 DIAGNOSIS — J3089 Other allergic rhinitis: Secondary | ICD-10-CM | POA: Diagnosis not present

## 2017-03-13 DIAGNOSIS — J301 Allergic rhinitis due to pollen: Secondary | ICD-10-CM | POA: Diagnosis not present

## 2017-03-13 DIAGNOSIS — J3081 Allergic rhinitis due to animal (cat) (dog) hair and dander: Secondary | ICD-10-CM | POA: Diagnosis not present

## 2017-03-18 DIAGNOSIS — J301 Allergic rhinitis due to pollen: Secondary | ICD-10-CM | POA: Diagnosis not present

## 2017-03-18 DIAGNOSIS — J3081 Allergic rhinitis due to animal (cat) (dog) hair and dander: Secondary | ICD-10-CM | POA: Diagnosis not present

## 2017-03-19 DIAGNOSIS — J301 Allergic rhinitis due to pollen: Secondary | ICD-10-CM | POA: Diagnosis not present

## 2017-03-19 DIAGNOSIS — J3081 Allergic rhinitis due to animal (cat) (dog) hair and dander: Secondary | ICD-10-CM | POA: Diagnosis not present

## 2017-03-19 DIAGNOSIS — J3089 Other allergic rhinitis: Secondary | ICD-10-CM | POA: Diagnosis not present

## 2017-03-26 DIAGNOSIS — N952 Postmenopausal atrophic vaginitis: Secondary | ICD-10-CM | POA: Diagnosis not present

## 2017-03-26 DIAGNOSIS — Z79899 Other long term (current) drug therapy: Secondary | ICD-10-CM | POA: Diagnosis not present

## 2017-03-26 DIAGNOSIS — J301 Allergic rhinitis due to pollen: Secondary | ICD-10-CM | POA: Diagnosis not present

## 2017-03-26 DIAGNOSIS — N951 Menopausal and female climacteric states: Secondary | ICD-10-CM | POA: Diagnosis not present

## 2017-03-26 DIAGNOSIS — J3089 Other allergic rhinitis: Secondary | ICD-10-CM | POA: Diagnosis not present

## 2017-03-26 DIAGNOSIS — R946 Abnormal results of thyroid function studies: Secondary | ICD-10-CM | POA: Diagnosis not present

## 2017-03-26 DIAGNOSIS — J3081 Allergic rhinitis due to animal (cat) (dog) hair and dander: Secondary | ICD-10-CM | POA: Diagnosis not present

## 2017-03-26 DIAGNOSIS — R945 Abnormal results of liver function studies: Secondary | ICD-10-CM | POA: Diagnosis not present

## 2017-03-29 ENCOUNTER — Other Ambulatory Visit: Payer: Self-pay | Admitting: Obstetrics & Gynecology

## 2017-03-29 DIAGNOSIS — Z1231 Encounter for screening mammogram for malignant neoplasm of breast: Secondary | ICD-10-CM

## 2017-04-04 DIAGNOSIS — J301 Allergic rhinitis due to pollen: Secondary | ICD-10-CM | POA: Diagnosis not present

## 2017-04-04 DIAGNOSIS — J3081 Allergic rhinitis due to animal (cat) (dog) hair and dander: Secondary | ICD-10-CM | POA: Diagnosis not present

## 2017-04-04 DIAGNOSIS — J3089 Other allergic rhinitis: Secondary | ICD-10-CM | POA: Diagnosis not present

## 2017-04-11 DIAGNOSIS — M7061 Trochanteric bursitis, right hip: Secondary | ICD-10-CM | POA: Diagnosis not present

## 2017-04-11 DIAGNOSIS — M5440 Lumbago with sciatica, unspecified side: Secondary | ICD-10-CM | POA: Diagnosis not present

## 2017-04-12 DIAGNOSIS — J3081 Allergic rhinitis due to animal (cat) (dog) hair and dander: Secondary | ICD-10-CM | POA: Diagnosis not present

## 2017-04-12 DIAGNOSIS — J301 Allergic rhinitis due to pollen: Secondary | ICD-10-CM | POA: Diagnosis not present

## 2017-04-12 DIAGNOSIS — J3089 Other allergic rhinitis: Secondary | ICD-10-CM | POA: Diagnosis not present

## 2017-04-15 ENCOUNTER — Other Ambulatory Visit: Payer: Self-pay | Admitting: Neurosurgery

## 2017-04-15 DIAGNOSIS — M5440 Lumbago with sciatica, unspecified side: Secondary | ICD-10-CM

## 2017-04-15 DIAGNOSIS — M7061 Trochanteric bursitis, right hip: Secondary | ICD-10-CM

## 2017-04-18 DIAGNOSIS — J3089 Other allergic rhinitis: Secondary | ICD-10-CM | POA: Diagnosis not present

## 2017-04-18 DIAGNOSIS — J3081 Allergic rhinitis due to animal (cat) (dog) hair and dander: Secondary | ICD-10-CM | POA: Diagnosis not present

## 2017-04-18 DIAGNOSIS — J301 Allergic rhinitis due to pollen: Secondary | ICD-10-CM | POA: Diagnosis not present

## 2017-04-25 ENCOUNTER — Ambulatory Visit
Admission: RE | Admit: 2017-04-25 | Discharge: 2017-04-25 | Disposition: A | Discharge status: Home / Self Care | Payer: 59 | Source: Ambulatory Visit | Attending: Obstetrics & Gynecology | Admitting: Obstetrics & Gynecology

## 2017-04-25 ENCOUNTER — Encounter: Payer: Self-pay | Admitting: Obstetrics & Gynecology

## 2017-04-25 DIAGNOSIS — J3081 Allergic rhinitis due to animal (cat) (dog) hair and dander: Secondary | ICD-10-CM | POA: Diagnosis not present

## 2017-04-25 DIAGNOSIS — Z1231 Encounter for screening mammogram for malignant neoplasm of breast: Secondary | ICD-10-CM | POA: Insufficient documentation

## 2017-04-25 DIAGNOSIS — J3089 Other allergic rhinitis: Secondary | ICD-10-CM | POA: Diagnosis not present

## 2017-04-25 DIAGNOSIS — J301 Allergic rhinitis due to pollen: Secondary | ICD-10-CM | POA: Diagnosis not present

## 2017-04-30 DIAGNOSIS — J3081 Allergic rhinitis due to animal (cat) (dog) hair and dander: Secondary | ICD-10-CM | POA: Diagnosis not present

## 2017-04-30 DIAGNOSIS — J301 Allergic rhinitis due to pollen: Secondary | ICD-10-CM | POA: Diagnosis not present

## 2017-04-30 DIAGNOSIS — J3089 Other allergic rhinitis: Secondary | ICD-10-CM | POA: Diagnosis not present

## 2017-05-02 ENCOUNTER — Ambulatory Visit
Admission: RE | Admit: 2017-05-02 | Discharge: 2017-05-02 | Disposition: A | Discharge status: Home / Self Care | Payer: 59 | Source: Ambulatory Visit | Attending: Neurosurgery | Admitting: Neurosurgery

## 2017-05-02 ENCOUNTER — Other Ambulatory Visit: Payer: Self-pay | Admitting: Neurosurgery

## 2017-05-02 ENCOUNTER — Inpatient Hospital Stay
Admission: RE | Admit: 2017-05-02 | Discharge: 2017-05-02 | Disposition: A | Discharge status: Home / Self Care | Payer: Self-pay | Source: Ambulatory Visit | Attending: Neurosurgery | Admitting: Neurosurgery

## 2017-05-02 DIAGNOSIS — M5440 Lumbago with sciatica, unspecified side: Secondary | ICD-10-CM

## 2017-05-02 DIAGNOSIS — M47817 Spondylosis without myelopathy or radiculopathy, lumbosacral region: Secondary | ICD-10-CM | POA: Diagnosis not present

## 2017-05-02 MED ORDER — METHYLPREDNISOLONE ACETATE 40 MG/ML INJ SUSP (RADIOLOG
120.0000 mg | Freq: Once | INTRAMUSCULAR | Status: AC
  Administered 2017-05-02: 120 mg via EPIDURAL

## 2017-05-02 MED ORDER — IOPAMIDOL (ISOVUE-M 200) INJECTION 41%
1.0000 mL | Freq: Once | INTRAMUSCULAR | Status: AC
  Administered 2017-05-02: 1 mL via EPIDURAL

## 2017-05-07 DIAGNOSIS — J301 Allergic rhinitis due to pollen: Secondary | ICD-10-CM | POA: Diagnosis not present

## 2017-05-07 DIAGNOSIS — J3089 Other allergic rhinitis: Secondary | ICD-10-CM | POA: Diagnosis not present

## 2017-05-07 DIAGNOSIS — J3081 Allergic rhinitis due to animal (cat) (dog) hair and dander: Secondary | ICD-10-CM | POA: Diagnosis not present

## 2017-05-13 DIAGNOSIS — G43719 Chronic migraine without aura, intractable, without status migrainosus: Secondary | ICD-10-CM | POA: Diagnosis not present

## 2017-05-14 DIAGNOSIS — J301 Allergic rhinitis due to pollen: Secondary | ICD-10-CM | POA: Diagnosis not present

## 2017-05-14 DIAGNOSIS — J3081 Allergic rhinitis due to animal (cat) (dog) hair and dander: Secondary | ICD-10-CM | POA: Diagnosis not present

## 2017-05-14 DIAGNOSIS — J3089 Other allergic rhinitis: Secondary | ICD-10-CM | POA: Diagnosis not present

## 2017-05-16 DIAGNOSIS — J3089 Other allergic rhinitis: Secondary | ICD-10-CM | POA: Diagnosis not present

## 2017-05-23 DIAGNOSIS — J3081 Allergic rhinitis due to animal (cat) (dog) hair and dander: Secondary | ICD-10-CM | POA: Diagnosis not present

## 2017-05-23 DIAGNOSIS — J3089 Other allergic rhinitis: Secondary | ICD-10-CM | POA: Diagnosis not present

## 2017-05-23 DIAGNOSIS — J301 Allergic rhinitis due to pollen: Secondary | ICD-10-CM | POA: Diagnosis not present

## 2017-06-06 DIAGNOSIS — G43719 Chronic migraine without aura, intractable, without status migrainosus: Secondary | ICD-10-CM | POA: Diagnosis not present

## 2017-06-11 DIAGNOSIS — D485 Neoplasm of uncertain behavior of skin: Secondary | ICD-10-CM | POA: Diagnosis not present

## 2017-06-11 DIAGNOSIS — L988 Other specified disorders of the skin and subcutaneous tissue: Secondary | ICD-10-CM | POA: Diagnosis not present

## 2017-06-11 DIAGNOSIS — Z86018 Personal history of other benign neoplasm: Secondary | ICD-10-CM

## 2017-06-11 HISTORY — DX: Personal history of other benign neoplasm: Z86.018

## 2017-06-12 DIAGNOSIS — J301 Allergic rhinitis due to pollen: Secondary | ICD-10-CM | POA: Diagnosis not present

## 2017-06-12 DIAGNOSIS — J3089 Other allergic rhinitis: Secondary | ICD-10-CM | POA: Diagnosis not present

## 2017-06-12 DIAGNOSIS — J3081 Allergic rhinitis due to animal (cat) (dog) hair and dander: Secondary | ICD-10-CM | POA: Diagnosis not present

## 2017-06-20 DIAGNOSIS — J3081 Allergic rhinitis due to animal (cat) (dog) hair and dander: Secondary | ICD-10-CM | POA: Diagnosis not present

## 2017-06-20 DIAGNOSIS — J301 Allergic rhinitis due to pollen: Secondary | ICD-10-CM | POA: Diagnosis not present

## 2017-06-20 DIAGNOSIS — J3089 Other allergic rhinitis: Secondary | ICD-10-CM | POA: Diagnosis not present

## 2017-06-24 DIAGNOSIS — J301 Allergic rhinitis due to pollen: Secondary | ICD-10-CM | POA: Diagnosis not present

## 2017-06-24 DIAGNOSIS — J3089 Other allergic rhinitis: Secondary | ICD-10-CM | POA: Diagnosis not present

## 2017-06-24 DIAGNOSIS — J3081 Allergic rhinitis due to animal (cat) (dog) hair and dander: Secondary | ICD-10-CM | POA: Diagnosis not present

## 2017-06-27 DIAGNOSIS — J3089 Other allergic rhinitis: Secondary | ICD-10-CM | POA: Diagnosis not present

## 2017-06-27 DIAGNOSIS — J3081 Allergic rhinitis due to animal (cat) (dog) hair and dander: Secondary | ICD-10-CM | POA: Diagnosis not present

## 2017-06-27 DIAGNOSIS — J301 Allergic rhinitis due to pollen: Secondary | ICD-10-CM | POA: Diagnosis not present

## 2017-07-01 ENCOUNTER — Other Ambulatory Visit: Payer: Self-pay | Admitting: Student

## 2017-07-01 DIAGNOSIS — M5416 Radiculopathy, lumbar region: Secondary | ICD-10-CM

## 2017-07-03 DIAGNOSIS — J301 Allergic rhinitis due to pollen: Secondary | ICD-10-CM | POA: Diagnosis not present

## 2017-07-03 DIAGNOSIS — J3089 Other allergic rhinitis: Secondary | ICD-10-CM | POA: Diagnosis not present

## 2017-07-03 DIAGNOSIS — J3081 Allergic rhinitis due to animal (cat) (dog) hair and dander: Secondary | ICD-10-CM | POA: Diagnosis not present

## 2017-07-05 DIAGNOSIS — J3089 Other allergic rhinitis: Secondary | ICD-10-CM | POA: Diagnosis not present

## 2017-07-05 DIAGNOSIS — J301 Allergic rhinitis due to pollen: Secondary | ICD-10-CM | POA: Diagnosis not present

## 2017-07-08 DIAGNOSIS — S81831A Puncture wound without foreign body, right lower leg, initial encounter: Secondary | ICD-10-CM | POA: Diagnosis not present

## 2017-07-08 DIAGNOSIS — W5501XA Bitten by cat, initial encounter: Secondary | ICD-10-CM | POA: Diagnosis not present

## 2017-07-09 DIAGNOSIS — G43719 Chronic migraine without aura, intractable, without status migrainosus: Secondary | ICD-10-CM | POA: Diagnosis not present

## 2017-07-10 ENCOUNTER — Ambulatory Visit
Admission: RE | Admit: 2017-07-10 | Discharge: 2017-07-10 | Disposition: A | Discharge status: Home / Self Care | Payer: 59 | Source: Ambulatory Visit | Attending: Student | Admitting: Student

## 2017-07-10 ENCOUNTER — Other Ambulatory Visit: Payer: Self-pay | Admitting: Student

## 2017-07-10 DIAGNOSIS — M47817 Spondylosis without myelopathy or radiculopathy, lumbosacral region: Secondary | ICD-10-CM | POA: Diagnosis not present

## 2017-07-10 DIAGNOSIS — J3081 Allergic rhinitis due to animal (cat) (dog) hair and dander: Secondary | ICD-10-CM | POA: Diagnosis not present

## 2017-07-10 DIAGNOSIS — J301 Allergic rhinitis due to pollen: Secondary | ICD-10-CM | POA: Diagnosis not present

## 2017-07-10 DIAGNOSIS — M5416 Radiculopathy, lumbar region: Secondary | ICD-10-CM

## 2017-07-10 DIAGNOSIS — J3089 Other allergic rhinitis: Secondary | ICD-10-CM | POA: Diagnosis not present

## 2017-07-10 MED ORDER — METHYLPREDNISOLONE ACETATE 40 MG/ML INJ SUSP (RADIOLOG
120.0000 mg | Freq: Once | INTRAMUSCULAR | Status: AC
  Administered 2017-07-10: 120 mg via EPIDURAL

## 2017-07-10 MED ORDER — IOPAMIDOL (ISOVUE-M 200) INJECTION 41%
1.0000 mL | Freq: Once | INTRAMUSCULAR | Status: AC
  Administered 2017-07-10: 1 mL via EPIDURAL

## 2017-07-18 DIAGNOSIS — J3081 Allergic rhinitis due to animal (cat) (dog) hair and dander: Secondary | ICD-10-CM | POA: Diagnosis not present

## 2017-07-18 DIAGNOSIS — J301 Allergic rhinitis due to pollen: Secondary | ICD-10-CM | POA: Diagnosis not present

## 2017-07-18 DIAGNOSIS — J3089 Other allergic rhinitis: Secondary | ICD-10-CM | POA: Diagnosis not present

## 2017-07-25 DIAGNOSIS — J3089 Other allergic rhinitis: Secondary | ICD-10-CM | POA: Diagnosis not present

## 2017-07-25 DIAGNOSIS — J301 Allergic rhinitis due to pollen: Secondary | ICD-10-CM | POA: Diagnosis not present

## 2017-07-25 DIAGNOSIS — J3081 Allergic rhinitis due to animal (cat) (dog) hair and dander: Secondary | ICD-10-CM | POA: Diagnosis not present

## 2017-08-02 DIAGNOSIS — J3081 Allergic rhinitis due to animal (cat) (dog) hair and dander: Secondary | ICD-10-CM | POA: Diagnosis not present

## 2017-08-02 DIAGNOSIS — J3089 Other allergic rhinitis: Secondary | ICD-10-CM | POA: Diagnosis not present

## 2017-08-02 DIAGNOSIS — J301 Allergic rhinitis due to pollen: Secondary | ICD-10-CM | POA: Diagnosis not present

## 2017-08-06 DIAGNOSIS — N951 Menopausal and female climacteric states: Secondary | ICD-10-CM | POA: Diagnosis not present

## 2017-08-06 DIAGNOSIS — G4700 Insomnia, unspecified: Secondary | ICD-10-CM | POA: Diagnosis not present

## 2017-08-06 DIAGNOSIS — Z1389 Encounter for screening for other disorder: Secondary | ICD-10-CM | POA: Diagnosis not present

## 2017-08-06 DIAGNOSIS — N952 Postmenopausal atrophic vaginitis: Secondary | ICD-10-CM | POA: Diagnosis not present

## 2017-08-14 DIAGNOSIS — J3089 Other allergic rhinitis: Secondary | ICD-10-CM | POA: Diagnosis not present

## 2017-08-14 DIAGNOSIS — J3081 Allergic rhinitis due to animal (cat) (dog) hair and dander: Secondary | ICD-10-CM | POA: Diagnosis not present

## 2017-08-14 DIAGNOSIS — J301 Allergic rhinitis due to pollen: Secondary | ICD-10-CM | POA: Diagnosis not present

## 2017-08-22 DIAGNOSIS — J3081 Allergic rhinitis due to animal (cat) (dog) hair and dander: Secondary | ICD-10-CM | POA: Diagnosis not present

## 2017-08-22 DIAGNOSIS — J3089 Other allergic rhinitis: Secondary | ICD-10-CM | POA: Diagnosis not present

## 2017-08-22 DIAGNOSIS — J301 Allergic rhinitis due to pollen: Secondary | ICD-10-CM | POA: Diagnosis not present

## 2017-08-23 DIAGNOSIS — G43719 Chronic migraine without aura, intractable, without status migrainosus: Secondary | ICD-10-CM | POA: Diagnosis not present

## 2017-08-26 ENCOUNTER — Other Ambulatory Visit: Payer: Self-pay | Admitting: Student

## 2017-08-26 DIAGNOSIS — M5440 Lumbago with sciatica, unspecified side: Secondary | ICD-10-CM

## 2017-09-03 DIAGNOSIS — J301 Allergic rhinitis due to pollen: Secondary | ICD-10-CM | POA: Diagnosis not present

## 2017-09-03 DIAGNOSIS — J3081 Allergic rhinitis due to animal (cat) (dog) hair and dander: Secondary | ICD-10-CM | POA: Diagnosis not present

## 2017-09-03 DIAGNOSIS — J3089 Other allergic rhinitis: Secondary | ICD-10-CM | POA: Diagnosis not present

## 2017-09-05 DIAGNOSIS — G43719 Chronic migraine without aura, intractable, without status migrainosus: Secondary | ICD-10-CM | POA: Diagnosis not present

## 2017-09-10 ENCOUNTER — Ambulatory Visit
Admission: RE | Admit: 2017-09-10 | Discharge: 2017-09-10 | Disposition: A | Discharge status: Home / Self Care | Payer: 59 | Source: Ambulatory Visit | Attending: Student | Admitting: Student

## 2017-09-10 DIAGNOSIS — M5440 Lumbago with sciatica, unspecified side: Secondary | ICD-10-CM

## 2017-09-10 DIAGNOSIS — M47817 Spondylosis without myelopathy or radiculopathy, lumbosacral region: Secondary | ICD-10-CM | POA: Diagnosis not present

## 2017-09-10 MED ORDER — IOPAMIDOL (ISOVUE-M 200) INJECTION 41%
1.0000 mL | Freq: Once | INTRAMUSCULAR | Status: AC
  Administered 2017-09-10: 1 mL via EPIDURAL

## 2017-09-10 MED ORDER — METHYLPREDNISOLONE ACETATE 40 MG/ML INJ SUSP (RADIOLOG
120.0000 mg | Freq: Once | INTRAMUSCULAR | Status: AC
  Administered 2017-09-10: 120 mg via EPIDURAL

## 2017-09-13 DIAGNOSIS — J3081 Allergic rhinitis due to animal (cat) (dog) hair and dander: Secondary | ICD-10-CM | POA: Diagnosis not present

## 2017-09-13 DIAGNOSIS — J3089 Other allergic rhinitis: Secondary | ICD-10-CM | POA: Diagnosis not present

## 2017-09-13 DIAGNOSIS — J301 Allergic rhinitis due to pollen: Secondary | ICD-10-CM | POA: Diagnosis not present

## 2017-09-19 DIAGNOSIS — J3089 Other allergic rhinitis: Secondary | ICD-10-CM | POA: Diagnosis not present

## 2017-09-19 DIAGNOSIS — J3081 Allergic rhinitis due to animal (cat) (dog) hair and dander: Secondary | ICD-10-CM | POA: Diagnosis not present

## 2017-09-19 DIAGNOSIS — J301 Allergic rhinitis due to pollen: Secondary | ICD-10-CM | POA: Diagnosis not present

## 2017-09-24 DIAGNOSIS — J3081 Allergic rhinitis due to animal (cat) (dog) hair and dander: Secondary | ICD-10-CM | POA: Diagnosis not present

## 2017-09-24 DIAGNOSIS — J301 Allergic rhinitis due to pollen: Secondary | ICD-10-CM | POA: Diagnosis not present

## 2017-09-24 DIAGNOSIS — J3089 Other allergic rhinitis: Secondary | ICD-10-CM | POA: Diagnosis not present

## 2017-09-27 DIAGNOSIS — J301 Allergic rhinitis due to pollen: Secondary | ICD-10-CM | POA: Diagnosis not present

## 2017-09-27 DIAGNOSIS — J3081 Allergic rhinitis due to animal (cat) (dog) hair and dander: Secondary | ICD-10-CM | POA: Diagnosis not present

## 2017-10-01 DIAGNOSIS — J3081 Allergic rhinitis due to animal (cat) (dog) hair and dander: Secondary | ICD-10-CM | POA: Diagnosis not present

## 2017-10-01 DIAGNOSIS — J301 Allergic rhinitis due to pollen: Secondary | ICD-10-CM | POA: Diagnosis not present

## 2017-10-01 DIAGNOSIS — J3089 Other allergic rhinitis: Secondary | ICD-10-CM | POA: Diagnosis not present

## 2017-10-09 DIAGNOSIS — J301 Allergic rhinitis due to pollen: Secondary | ICD-10-CM | POA: Diagnosis not present

## 2017-10-09 DIAGNOSIS — J3081 Allergic rhinitis due to animal (cat) (dog) hair and dander: Secondary | ICD-10-CM | POA: Diagnosis not present

## 2017-10-09 DIAGNOSIS — J3089 Other allergic rhinitis: Secondary | ICD-10-CM | POA: Diagnosis not present

## 2017-10-14 DIAGNOSIS — J3081 Allergic rhinitis due to animal (cat) (dog) hair and dander: Secondary | ICD-10-CM | POA: Diagnosis not present

## 2017-10-14 DIAGNOSIS — J3089 Other allergic rhinitis: Secondary | ICD-10-CM | POA: Diagnosis not present

## 2017-10-14 DIAGNOSIS — J301 Allergic rhinitis due to pollen: Secondary | ICD-10-CM | POA: Diagnosis not present

## 2017-10-15 DIAGNOSIS — J3089 Other allergic rhinitis: Secondary | ICD-10-CM | POA: Diagnosis not present

## 2017-10-18 ENCOUNTER — Ambulatory Visit: Payer: 59 | Admitting: Nurse Practitioner

## 2017-10-18 ENCOUNTER — Encounter: Payer: Self-pay | Admitting: Nurse Practitioner

## 2017-10-18 VITALS — BP 102/72 | HR 97 | Temp 97.7°F | Ht 69.0 in | Wt 168.0 lb

## 2017-10-18 DIAGNOSIS — K130 Diseases of lips: Secondary | ICD-10-CM | POA: Diagnosis not present

## 2017-10-18 NOTE — Progress Notes (Signed)
  Subjective:     Patient ID: Michelle Mccarthy , female    DOB: [birth year] , 57 y.o.   MRN: [id number]   Reports history of dry mouth over the last week.  She had been using lemon in her water but stopped this was new.   She continues to go to an allergist for injections.  Seems to have worsened in the last week.  She is using emgality for her migraines has had 3 doses.  She has been doing a keto diet for the last year.  Concerned she may have a sensitivity to a food.  This has been present for several years and drinks large amounts of water. She reports she had some swelling to her lips.  Denies burning, tingling or sores to mouth    No past medical history on file.    Current Outpatient Medications:  .  Cholecalciferol (VITAMIN D3) 5000 units TABS, Take 1 capsule by mouth once., Disp: , Rfl:  .  desloratadine (CLARINEX) 5 MG tablet, Take 5 mg by mouth daily., Disp: , Rfl:  .  Galcanezumab-gnlm (EMGALITY) 120 MG/ML SOAJ, Inject into the skin. One injection monthly, Disp: , Rfl:  .  imipramine (TOFRANIL) 50 MG tablet, Take 50 mg by mouth at bedtime., Disp: , Rfl:  .  POTASSIUM CHLORIDE PO, Take 1 tablet by mouth once., Disp: , Rfl:  .  Progesterone Micronized (PROGESTERONE PO), Take 1 tablet by mouth daily., Disp: , Rfl:  .  vitamin E 1000 UNIT capsule, Take 1,000 Units by mouth daily., Disp: , Rfl:    No Known Allergies   Review of Systems  HENT: Negative.  Negative for mouth sores.        Lips are dry, scaly and slightly red  Respiratory: Negative.   Cardiovascular: Negative.   Gastrointestinal: Negative.   Musculoskeletal: Negative.   Skin:       Lips are chapped and slightly reddened  Allergic/Immunologic: Positive for environmental allergies. Negative for food allergies.     Today's Vitals   10/18/17 1614  BP: 102/72  Pulse: 97  Temp: 97.7 F (36.5 C)  TempSrc: Oral  SpO2: 96%  Weight: 168 lb (76.2 kg)  Height: 5\' 9"  (1.753 m)  PainSc: 0-No pain   Body mass index is  24.81 kg/m.   Objective:  Physical Exam  Constitutional: She appears well-developed and well-nourished.  Cardiovascular: Normal rate, regular rhythm, normal heart sounds and intact distal pulses.  Pulmonary/Chest: Effort normal and breath sounds normal.  Skin: Skin is warm and dry.  No dryness noted to oral mucosa, lips appear slightly chapped and slightly erythematous and a little "puffy"        Assessment And Plan:     1. Dry lips  Will check for food allergies.  May have a food sensitivity or related to increased citrus may be causing chapping vs vitamin deficiciency  Encouraged to take a multivitamin with Vitamin A daily  Continue with good water intake.   Encouraged to hold or decrease the frequency of her antihistamine  Advised if any shortness of breath or tongue swelling to go to ER - Food Allergy Profile       Minette Brine, FNP

## 2017-10-22 LAB — FOOD ALLERGY PROFILE
Allergen Corn, IgE: 0.32 kU/L — AB
CLAM IGE: 0.18 kU/L — AB
Codfish IgE: <0.1 kU/L
EGG WHITE IGE: 0.13 kU/L — AB
Milk IgE: 0.12 kU/L — AB
PEANUT IGE: 0.48 kU/L — AB
Scallop IgE: 0.32 kU/L — AB
Sesame Seed IgE: 0.57 kU/L — AB
Shrimp IgE: 0.12 kU/L — AB
Soybean IgE: 0.27 kU/L — AB
WHEAT IGE: 0.38 kU/L — AB
Walnut IgE: 0.35 kU/L — AB

## 2017-10-23 DIAGNOSIS — J3081 Allergic rhinitis due to animal (cat) (dog) hair and dander: Secondary | ICD-10-CM | POA: Diagnosis not present

## 2017-10-23 DIAGNOSIS — J301 Allergic rhinitis due to pollen: Secondary | ICD-10-CM | POA: Diagnosis not present

## 2017-10-23 DIAGNOSIS — J3089 Other allergic rhinitis: Secondary | ICD-10-CM | POA: Diagnosis not present

## 2017-11-06 ENCOUNTER — Ambulatory Visit: Payer: 59 | Admitting: Internal Medicine

## 2017-11-06 ENCOUNTER — Encounter: Payer: Self-pay | Admitting: Internal Medicine

## 2017-11-06 VITALS — BP 114/76 | HR 101 | Temp 98.7°F | Ht 69.0 in | Wt 168.8 lb

## 2017-11-06 DIAGNOSIS — Z79899 Other long term (current) drug therapy: Secondary | ICD-10-CM | POA: Diagnosis not present

## 2017-11-06 DIAGNOSIS — R682 Dry mouth, unspecified: Secondary | ICD-10-CM

## 2017-11-06 DIAGNOSIS — J3089 Other allergic rhinitis: Secondary | ICD-10-CM | POA: Diagnosis not present

## 2017-11-06 DIAGNOSIS — J301 Allergic rhinitis due to pollen: Secondary | ICD-10-CM | POA: Diagnosis not present

## 2017-11-06 DIAGNOSIS — J3081 Allergic rhinitis due to animal (cat) (dog) hair and dander: Secondary | ICD-10-CM | POA: Diagnosis not present

## 2017-11-06 DIAGNOSIS — G43009 Migraine without aura, not intractable, without status migrainosus: Secondary | ICD-10-CM | POA: Diagnosis not present

## 2017-11-06 DIAGNOSIS — N951 Menopausal and female climacteric states: Secondary | ICD-10-CM | POA: Diagnosis not present

## 2017-11-06 NOTE — Progress Notes (Signed)
Subjective:     Patient ID: Michelle Mccarthy , female    DOB: [birth year] , 57 y.o.   MRN: [id number]   Chief Complaint  Patient presents with  . Hormones f/u    HPI  She is here today for f/u BHRT. She has stopped the vaginal cream b/c it started to cause her some irritation. She states that one night it kept her up all night. She does not wish to use this anymore. She has been consistent with the progesterone capsules. She has not had any worsening of her sleep issues.     Past Medical History:  Diagnosis Date  . Hx of migraines   . Insomnia   . 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 Postmenopausal atrophic vaginitis      Family History  Problem Relation Age of Onset  . Breast cancer Neg Hx      Current Outpatient Medications:  Marland Kitchen  Multiple Vitamin (MULTIVITAMIN) tablet, Take 1 tablet by mouth daily., Disp: , Rfl:  .  Cholecalciferol (VITAMIN D3) 5000 units TABS, Take 1 capsule by mouth once., Disp: , Rfl:  .  desloratadine (CLARINEX) 5 MG tablet, Take 5 mg by mouth daily., Disp: , Rfl:  .  Galcanezumab-gnlm (EMGALITY) 120 MG/ML SOAJ, Inject into the skin. One injection monthly, Disp: , Rfl:  .  imipramine (TOFRANIL) 50 MG tablet, Take 50 mg by mouth at bedtime., Disp: , Rfl:  .  POTASSIUM CHLORIDE PO, Take 1 tablet by mouth once., Disp: , Rfl:  .  Progesterone Micronized (PROGESTERONE PO), Take 1 tablet by mouth daily., Disp: , Rfl:  .  vitamin E 1000 UNIT capsule, Take 1,000 Units by mouth daily., Disp: , Rfl:    No Known Allergies   Review of Systems  Constitutional: Negative.   HENT:       She c/o dry mouth. Thinks it is due to lemons she was putting in her water. She initially stopped using lemons, yet her symptoms have returned. She has been evaluated by her dentist, who suggested Biotene. This has yet to help her symptoms.  Eyes: Negative.   Cardiovascular: Negative.   Gastrointestinal: Negative.   Genitourinary: Negative.   Skin: Negative.   Neurological:  Negative.   Psychiatric/Behavioral: Negative.      Today's Vitals   11/06/17 1012  BP: 114/76  Pulse: (!) 101  Temp: 98.7 F (37.1 C)  TempSrc: Oral  Weight: 168 lb 12.8 oz (76.6 kg)  Height: 5\' 9"  (1.753 m)   Body mass index is 24.93 kg/m.   Objective:  Physical Exam  Constitutional: She is oriented to person, place, and time. She appears well-developed and well-nourished.  HENT:  Dry, chapped lips.   Eyes: EOM are normal.  Cardiovascular: Normal rate, regular rhythm and normal heart sounds.  Pulmonary/Chest: Effort normal and breath sounds normal.  Neurological: She is alert and oriented to person, place, and time.  Skin: Skin is warm and dry.  Psychiatric: She has a normal mood and affect.  Nursing note and vitals reviewed.       Assessment And Plan:     1. 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 She will decrease dosing of vaginal cream to twice weekly. I will call in refills of Progesterone into [facility] when needed.   2. Dry mouth, unspecified  I will check ANA today to r/o possibility of Sjogrens. She denies dry eyes. If persistent, I will refer her to ENT for further evaluation.   - ANA,  IFA (with reflex)    3. Migraine without aura and without status migrainosus, not intractable  Chronic, intermittent. She is encouraged to continue with MG supplementation. She is also followed by Dr. Domingo Cocking.   4. Drug therapy  -Liver Profile    [name], MD

## 2017-11-06 NOTE — Patient Instructions (Signed)

## 2017-11-07 LAB — HEPATIC FUNCTION PANEL
ALBUMIN: 4.4 g/dL (ref 3.5–5.5)
ALK PHOS: 80 IU/L (ref 39–117)
ALT: 28 IU/L (ref 0–32)
AST: 27 IU/L (ref 0–40)
BILIRUBIN, DIRECT: 0.08 mg/dL (ref 0.00–0.40)
Bilirubin Total: 0.2 mg/dL (ref 0.0–1.2)
TOTAL PROTEIN: 6.7 g/dL (ref 6.0–8.5)

## 2017-11-07 LAB — ANTINUCLEAR ANTIBODIES, IFA: ANA Titer 1: NEGATIVE

## 2017-11-07 NOTE — Progress Notes (Signed)
Your ANA, screening test for autoimmune disorders, is negative. Your liver function is normal. Be sure to incorporate the bone broth into your diet.

## 2017-11-13 ENCOUNTER — Other Ambulatory Visit: Payer: Self-pay | Admitting: Neurosurgery

## 2017-11-13 DIAGNOSIS — J3081 Allergic rhinitis due to animal (cat) (dog) hair and dander: Secondary | ICD-10-CM | POA: Diagnosis not present

## 2017-11-13 DIAGNOSIS — M5440 Lumbago with sciatica, unspecified side: Secondary | ICD-10-CM

## 2017-11-13 DIAGNOSIS — J3089 Other allergic rhinitis: Secondary | ICD-10-CM | POA: Diagnosis not present

## 2017-11-13 DIAGNOSIS — J301 Allergic rhinitis due to pollen: Secondary | ICD-10-CM | POA: Diagnosis not present

## 2017-11-19 DIAGNOSIS — J3089 Other allergic rhinitis: Secondary | ICD-10-CM | POA: Diagnosis not present

## 2017-11-19 DIAGNOSIS — J4530 Mild persistent asthma, uncomplicated: Secondary | ICD-10-CM | POA: Diagnosis not present

## 2017-11-19 DIAGNOSIS — J301 Allergic rhinitis due to pollen: Secondary | ICD-10-CM | POA: Diagnosis not present

## 2017-11-19 DIAGNOSIS — J0100 Acute maxillary sinusitis, unspecified: Secondary | ICD-10-CM | POA: Diagnosis not present

## 2017-11-21 ENCOUNTER — Other Ambulatory Visit: Payer: Self-pay

## 2017-11-22 ENCOUNTER — Other Ambulatory Visit: Payer: Self-pay

## 2017-11-26 ENCOUNTER — Other Ambulatory Visit: Payer: Self-pay | Admitting: Neurosurgery

## 2017-11-26 ENCOUNTER — Ambulatory Visit
Admission: RE | Admit: 2017-11-26 | Discharge: 2017-11-26 | Disposition: A | Discharge status: Home / Self Care | Payer: 59 | Source: Ambulatory Visit | Attending: Neurosurgery | Admitting: Neurosurgery

## 2017-11-26 DIAGNOSIS — M5440 Lumbago with sciatica, unspecified side: Secondary | ICD-10-CM

## 2017-11-26 DIAGNOSIS — M545 Low back pain: Secondary | ICD-10-CM | POA: Diagnosis not present

## 2017-11-26 MED ORDER — IOPAMIDOL (ISOVUE-M 200) INJECTION 41%
1.0000 mL | Freq: Once | INTRAMUSCULAR | Status: AC
  Administered 2017-11-26: 1 mL via EPIDURAL

## 2017-11-26 MED ORDER — METHYLPREDNISOLONE ACETATE 40 MG/ML INJ SUSP (RADIOLOG
120.0000 mg | Freq: Once | INTRAMUSCULAR | Status: AC
  Administered 2017-11-26: 120 mg via EPIDURAL

## 2017-11-27 DIAGNOSIS — L820 Inflamed seborrheic keratosis: Secondary | ICD-10-CM | POA: Diagnosis not present

## 2017-11-27 DIAGNOSIS — D229 Melanocytic nevi, unspecified: Secondary | ICD-10-CM

## 2017-11-27 DIAGNOSIS — L570 Actinic keratosis: Secondary | ICD-10-CM | POA: Diagnosis not present

## 2017-11-27 DIAGNOSIS — D225 Melanocytic nevi of trunk: Secondary | ICD-10-CM | POA: Diagnosis not present

## 2017-11-27 DIAGNOSIS — J3081 Allergic rhinitis due to animal (cat) (dog) hair and dander: Secondary | ICD-10-CM | POA: Diagnosis not present

## 2017-11-27 DIAGNOSIS — D485 Neoplasm of uncertain behavior of skin: Secondary | ICD-10-CM | POA: Diagnosis not present

## 2017-11-27 DIAGNOSIS — Z1283 Encounter for screening for malignant neoplasm of skin: Secondary | ICD-10-CM | POA: Diagnosis not present

## 2017-11-27 DIAGNOSIS — Z85828 Personal history of other malignant neoplasm of skin: Secondary | ICD-10-CM | POA: Diagnosis not present

## 2017-11-27 DIAGNOSIS — J3089 Other allergic rhinitis: Secondary | ICD-10-CM | POA: Diagnosis not present

## 2017-11-27 DIAGNOSIS — L578 Other skin changes due to chronic exposure to nonionizing radiation: Secondary | ICD-10-CM | POA: Diagnosis not present

## 2017-11-27 DIAGNOSIS — J301 Allergic rhinitis due to pollen: Secondary | ICD-10-CM | POA: Diagnosis not present

## 2017-11-27 HISTORY — DX: Melanocytic nevi, unspecified: D22.9

## 2017-12-03 DIAGNOSIS — J301 Allergic rhinitis due to pollen: Secondary | ICD-10-CM | POA: Diagnosis not present

## 2017-12-03 DIAGNOSIS — J3089 Other allergic rhinitis: Secondary | ICD-10-CM | POA: Diagnosis not present

## 2017-12-03 DIAGNOSIS — J3081 Allergic rhinitis due to animal (cat) (dog) hair and dander: Secondary | ICD-10-CM | POA: Diagnosis not present

## 2017-12-18 DIAGNOSIS — G43719 Chronic migraine without aura, intractable, without status migrainosus: Secondary | ICD-10-CM | POA: Diagnosis not present

## 2017-12-19 DIAGNOSIS — J301 Allergic rhinitis due to pollen: Secondary | ICD-10-CM | POA: Diagnosis not present

## 2017-12-19 DIAGNOSIS — J3089 Other allergic rhinitis: Secondary | ICD-10-CM | POA: Diagnosis not present

## 2017-12-19 DIAGNOSIS — J3081 Allergic rhinitis due to animal (cat) (dog) hair and dander: Secondary | ICD-10-CM | POA: Diagnosis not present

## 2017-12-23 DIAGNOSIS — G43719 Chronic migraine without aura, intractable, without status migrainosus: Secondary | ICD-10-CM | POA: Diagnosis not present

## 2018-01-03 DIAGNOSIS — J3081 Allergic rhinitis due to animal (cat) (dog) hair and dander: Secondary | ICD-10-CM | POA: Diagnosis not present

## 2018-01-03 DIAGNOSIS — J3089 Other allergic rhinitis: Secondary | ICD-10-CM | POA: Diagnosis not present

## 2018-01-03 DIAGNOSIS — J301 Allergic rhinitis due to pollen: Secondary | ICD-10-CM | POA: Diagnosis not present

## 2018-01-15 DIAGNOSIS — J301 Allergic rhinitis due to pollen: Secondary | ICD-10-CM | POA: Diagnosis not present

## 2018-01-15 DIAGNOSIS — J3081 Allergic rhinitis due to animal (cat) (dog) hair and dander: Secondary | ICD-10-CM | POA: Diagnosis not present

## 2018-01-15 DIAGNOSIS — J3089 Other allergic rhinitis: Secondary | ICD-10-CM | POA: Diagnosis not present

## 2018-01-17 ENCOUNTER — Other Ambulatory Visit: Payer: Self-pay | Admitting: Neurosurgery

## 2018-01-17 DIAGNOSIS — M5440 Lumbago with sciatica, unspecified side: Secondary | ICD-10-CM

## 2018-01-22 DIAGNOSIS — J301 Allergic rhinitis due to pollen: Secondary | ICD-10-CM | POA: Diagnosis not present

## 2018-01-22 DIAGNOSIS — J3089 Other allergic rhinitis: Secondary | ICD-10-CM | POA: Diagnosis not present

## 2018-01-22 DIAGNOSIS — J3081 Allergic rhinitis due to animal (cat) (dog) hair and dander: Secondary | ICD-10-CM | POA: Diagnosis not present

## 2018-01-23 ENCOUNTER — Ambulatory Visit
Admission: RE | Admit: 2018-01-23 | Discharge: 2018-01-23 | Disposition: A | Discharge status: Home / Self Care | Payer: 59 | Source: Ambulatory Visit | Attending: Neurosurgery | Admitting: Neurosurgery

## 2018-01-23 DIAGNOSIS — M5440 Lumbago with sciatica, unspecified side: Secondary | ICD-10-CM

## 2018-01-23 DIAGNOSIS — M545 Low back pain: Secondary | ICD-10-CM | POA: Diagnosis not present

## 2018-01-23 MED ORDER — IOPAMIDOL (ISOVUE-M 200) INJECTION 41%
1.0000 mL | Freq: Once | INTRAMUSCULAR | Status: AC
  Administered 2018-01-23: 1 mL via EPIDURAL

## 2018-01-23 MED ORDER — METHYLPREDNISOLONE ACETATE 40 MG/ML INJ SUSP (RADIOLOG
120.0000 mg | Freq: Once | INTRAMUSCULAR | Status: AC
  Administered 2018-01-23: 120 mg via EPIDURAL

## 2018-02-06 DIAGNOSIS — J3081 Allergic rhinitis due to animal (cat) (dog) hair and dander: Secondary | ICD-10-CM | POA: Diagnosis not present

## 2018-02-06 DIAGNOSIS — J3089 Other allergic rhinitis: Secondary | ICD-10-CM | POA: Diagnosis not present

## 2018-02-06 DIAGNOSIS — J301 Allergic rhinitis due to pollen: Secondary | ICD-10-CM | POA: Diagnosis not present

## 2018-02-11 DIAGNOSIS — J301 Allergic rhinitis due to pollen: Secondary | ICD-10-CM | POA: Diagnosis not present

## 2018-02-11 DIAGNOSIS — J3081 Allergic rhinitis due to animal (cat) (dog) hair and dander: Secondary | ICD-10-CM | POA: Diagnosis not present

## 2018-02-11 DIAGNOSIS — M5416 Radiculopathy, lumbar region: Secondary | ICD-10-CM | POA: Diagnosis not present

## 2018-02-11 DIAGNOSIS — M5440 Lumbago with sciatica, unspecified side: Secondary | ICD-10-CM | POA: Diagnosis not present

## 2018-02-11 DIAGNOSIS — J3089 Other allergic rhinitis: Secondary | ICD-10-CM | POA: Diagnosis not present

## 2018-02-15 ENCOUNTER — Other Ambulatory Visit: Payer: Self-pay | Admitting: Neurosurgery

## 2018-02-15 DIAGNOSIS — M5440 Lumbago with sciatica, unspecified side: Secondary | ICD-10-CM

## 2018-02-27 ENCOUNTER — Ambulatory Visit
Admission: RE | Admit: 2018-02-27 | Discharge: 2018-02-27 | Disposition: A | Discharge status: Home / Self Care | Payer: 59 | Source: Ambulatory Visit | Attending: Neurosurgery | Admitting: Neurosurgery

## 2018-02-27 DIAGNOSIS — M5440 Lumbago with sciatica, unspecified side: Secondary | ICD-10-CM

## 2018-02-27 DIAGNOSIS — M545 Low back pain: Secondary | ICD-10-CM | POA: Diagnosis not present

## 2018-03-05 DIAGNOSIS — J3081 Allergic rhinitis due to animal (cat) (dog) hair and dander: Secondary | ICD-10-CM | POA: Diagnosis not present

## 2018-03-05 DIAGNOSIS — J301 Allergic rhinitis due to pollen: Secondary | ICD-10-CM | POA: Diagnosis not present

## 2018-03-05 DIAGNOSIS — J3089 Other allergic rhinitis: Secondary | ICD-10-CM | POA: Diagnosis not present

## 2018-03-10 DIAGNOSIS — G43719 Chronic migraine without aura, intractable, without status migrainosus: Secondary | ICD-10-CM | POA: Diagnosis not present

## 2018-03-11 ENCOUNTER — Encounter: Payer: Self-pay | Admitting: Internal Medicine

## 2018-03-11 ENCOUNTER — Ambulatory Visit: Payer: 59 | Admitting: Internal Medicine

## 2018-03-11 VITALS — BP 126/80 | HR 72 | Temp 98.4°F | Ht 69.0 in | Wt 170.4 lb

## 2018-03-11 DIAGNOSIS — J302 Other seasonal allergic rhinitis: Secondary | ICD-10-CM

## 2018-03-11 DIAGNOSIS — N952 Postmenopausal atrophic vaginitis: Secondary | ICD-10-CM

## 2018-03-11 DIAGNOSIS — N951 Menopausal and female climacteric states: Secondary | ICD-10-CM | POA: Diagnosis not present

## 2018-03-11 DIAGNOSIS — Z79899 Other long term (current) drug therapy: Secondary | ICD-10-CM | POA: Diagnosis not present

## 2018-03-11 NOTE — Progress Notes (Signed)
Subjective:     Patient ID: Michelle Mccarthy , female    DOB: 1960-11-21 , 58 y.o.   MRN: [id number]   Chief Complaint  Patient presents with  . Return for 4 month BHRT    also having still having dry month and congestion x 3 weeks , no fever, some coughing     HPI  She is here today for f/u BHRT. She feels well on her current regimen. At her last visit in October, she stopped the vaginal estriol/testosterone cream due to persistent irritation. She spoke to compounding pharmacy regarding her concerns and they changed the base used for the cream. She resumed the vaginal cream about two weeks ago.     Past Medical History:  Diagnosis Date  . Hx of migraines   . Insomnia   . 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 Postmenopausal atrophic vaginitis      Family History  Problem Relation Age of Onset  . Breast cancer Neg Hx      Current Outpatient Medications:  .  albuterol (PROVENTIL HFA;VENTOLIN HFA) 108 (90 Base) MCG/ACT inhaler, , Disp: , Rfl:  .  azelastine (ASTELIN) 0.1 % nasal spray, USE 1 SPRAY IN EACH NOSTRIL TWICE A DAY NASALLY 30 DAY, Disp: , Rfl: 5 .  Cholecalciferol (VITAMIN D3) 5000 units TABS, Take 1 capsule by mouth once., Disp: , Rfl:  .  desloratadine (CLARINEX) 5 MG tablet, Take 5 mg by mouth daily., Disp: , Rfl:  .  Galcanezumab-gnlm (EMGALITY) 120 MG/ML SOAJ, Inject into the skin. One injection monthly, Disp: , Rfl:  .  imipramine (TOFRANIL) 50 MG tablet, Take 50 mg by mouth at bedtime., Disp: , Rfl:  .  Multiple Vitamin (MULTIVITAMIN) tablet, Take 1 tablet by mouth daily., Disp: , Rfl:  .  POTASSIUM CHLORIDE PO, Take 1 tablet by mouth once., Disp: , Rfl:  .  Progesterone Micronized (PROGESTERONE PO), Take 1 tablet by mouth daily., Disp: , Rfl:  .  vitamin E 1000 UNIT capsule, Take 1,000 Units by mouth daily., Disp: , Rfl:    No Known Allergies   Review of Systems  Constitutional: Negative.   HENT: Positive for congestion and postnasal drip.    Respiratory: Negative.   Cardiovascular: Negative.   Gastrointestinal: Negative.   Neurological: Negative.   Psychiatric/Behavioral: Negative.      Today's Vitals   03/11/18 0840  BP: 126/80  Pulse: 72  Temp: 98.4 F (36.9 C)  TempSrc: Oral  Weight: 170 lb 6.4 oz (77.3 kg)  Height: _0  (1.753 m)   Body mass index is 25.16 kg/m.   Objective:  Physical Exam Vitals signs and nursing note reviewed.  Constitutional:      Appearance: Normal appearance.  HENT:     Head: Normocephalic and atraumatic.  Cardiovascular:     Rate and Rhythm: Normal rate and regular rhythm.     Heart sounds: Normal heart sounds.  Pulmonary:     Effort: Pulmonary effort is normal.     Breath sounds: Normal breath sounds.  Skin:    General: Skin is warm.  Neurological:     General: No focal deficit present.     Mental Status: She is alert.  Psychiatric:        Mood and Affect: Mood normal.        Behavior: Behavior normal.         Assessment And Plan:     1. Female climacteric state  She will continue with use  of progesterone Sr caps nightly and I will send in refills of vaginal cream. She will rto in four months for re-evaluation.   - Hepatitis C antibody; Future - CBC no Diff; Future - Testosterone,Free and Total; Future - CMP14+EGFR  2. Seasonal allergies  Chronic. As per allergist. She was given samples of Norel AD to take twice daily as needed for symptom relief. She will also consider use of xylitol nasal spray. She is also advised use of antihistamines may worsen her dry mouth.   3. Postmenopausal atrophic vaginitis  She has resumed estriol/testosterone vaginal cream three days weekly. Compounding pharmacy adjusted its formulation and it is more tolerable.   4. Drug therapy  She agrees to rto in four weeks for bloodwork.   [name], MD

## 2018-03-11 NOTE — Patient Instructions (Signed)

## 2018-03-12 DIAGNOSIS — J4530 Mild persistent asthma, uncomplicated: Secondary | ICD-10-CM | POA: Diagnosis not present

## 2018-03-12 DIAGNOSIS — J301 Allergic rhinitis due to pollen: Secondary | ICD-10-CM | POA: Diagnosis not present

## 2018-03-12 DIAGNOSIS — J3089 Other allergic rhinitis: Secondary | ICD-10-CM | POA: Diagnosis not present

## 2018-03-13 ENCOUNTER — Other Ambulatory Visit: Payer: Self-pay | Admitting: Neurosurgery

## 2018-03-13 DIAGNOSIS — M5137 Other intervertebral disc degeneration, lumbosacral region: Secondary | ICD-10-CM | POA: Diagnosis not present

## 2018-03-13 DIAGNOSIS — M5440 Lumbago with sciatica, unspecified side: Secondary | ICD-10-CM | POA: Diagnosis not present

## 2018-03-19 DIAGNOSIS — G43719 Chronic migraine without aura, intractable, without status migrainosus: Secondary | ICD-10-CM | POA: Diagnosis not present

## 2018-03-20 DIAGNOSIS — J3089 Other allergic rhinitis: Secondary | ICD-10-CM | POA: Diagnosis not present

## 2018-03-20 DIAGNOSIS — J3081 Allergic rhinitis due to animal (cat) (dog) hair and dander: Secondary | ICD-10-CM | POA: Diagnosis not present

## 2018-03-20 DIAGNOSIS — J301 Allergic rhinitis due to pollen: Secondary | ICD-10-CM | POA: Diagnosis not present

## 2018-04-01 ENCOUNTER — Other Ambulatory Visit: Payer: Self-pay

## 2018-04-02 DIAGNOSIS — J3089 Other allergic rhinitis: Secondary | ICD-10-CM | POA: Diagnosis not present

## 2018-04-02 DIAGNOSIS — J301 Allergic rhinitis due to pollen: Secondary | ICD-10-CM | POA: Diagnosis not present

## 2018-04-02 DIAGNOSIS — J3081 Allergic rhinitis due to animal (cat) (dog) hair and dander: Secondary | ICD-10-CM | POA: Diagnosis not present

## 2018-04-09 ENCOUNTER — Other Ambulatory Visit: Payer: Self-pay

## 2018-04-10 ENCOUNTER — Other Ambulatory Visit: Payer: 59

## 2018-04-10 ENCOUNTER — Other Ambulatory Visit: Payer: Self-pay

## 2018-04-10 ENCOUNTER — Other Ambulatory Visit: Payer: Self-pay | Admitting: Internal Medicine

## 2018-04-10 DIAGNOSIS — M5137 Other intervertebral disc degeneration, lumbosacral region: Secondary | ICD-10-CM

## 2018-04-10 DIAGNOSIS — N951 Menopausal and female climacteric states: Secondary | ICD-10-CM

## 2018-04-10 DIAGNOSIS — J301 Allergic rhinitis due to pollen: Secondary | ICD-10-CM | POA: Diagnosis not present

## 2018-04-10 DIAGNOSIS — J3089 Other allergic rhinitis: Secondary | ICD-10-CM | POA: Diagnosis not present

## 2018-04-10 DIAGNOSIS — J3081 Allergic rhinitis due to animal (cat) (dog) hair and dander: Secondary | ICD-10-CM | POA: Diagnosis not present

## 2018-04-10 DIAGNOSIS — Z7689 Persons encountering health services in other specified circumstances: Secondary | ICD-10-CM | POA: Diagnosis not present

## 2018-04-12 LAB — SPECIMEN STATUS REPORT

## 2018-04-12 LAB — CBC
Hematocrit: 39.5 %
Hemoglobin: 13.2 g/dL
MCH: 32 pg
MCHC: 33.4 g/dL
MCV: 96 fL
Platelets: 316 10*3/uL (ref 150–450)
RBC: 4.12 x10E6/uL
RDW: 12.6 %
WBC: 5.9 10*3/uL (ref 3.4–10.8)

## 2018-04-12 LAB — TESTOSTERONE, FREE, DIRECT
Testosterone, Free: 1.1 pg/mL
Testosterone, Total, LC/MS: 9.1 ng/dL

## 2018-04-12 LAB — HEPATITIS C ANTIBODY: Hep C Virus Ab: <0.1 s/co ratio (ref 0.0–0.9)

## 2018-04-25 DIAGNOSIS — J3081 Allergic rhinitis due to animal (cat) (dog) hair and dander: Secondary | ICD-10-CM | POA: Diagnosis not present

## 2018-04-25 DIAGNOSIS — J301 Allergic rhinitis due to pollen: Secondary | ICD-10-CM | POA: Diagnosis not present

## 2018-04-25 DIAGNOSIS — J3089 Other allergic rhinitis: Secondary | ICD-10-CM | POA: Diagnosis not present

## 2018-04-30 ENCOUNTER — Other Ambulatory Visit: Payer: Self-pay

## 2018-04-30 DIAGNOSIS — J3081 Allergic rhinitis due to animal (cat) (dog) hair and dander: Secondary | ICD-10-CM | POA: Diagnosis not present

## 2018-04-30 DIAGNOSIS — J301 Allergic rhinitis due to pollen: Secondary | ICD-10-CM | POA: Diagnosis not present

## 2018-04-30 DIAGNOSIS — J3089 Other allergic rhinitis: Secondary | ICD-10-CM | POA: Diagnosis not present

## 2018-05-08 DIAGNOSIS — J3089 Other allergic rhinitis: Secondary | ICD-10-CM | POA: Diagnosis not present

## 2018-05-08 DIAGNOSIS — J3081 Allergic rhinitis due to animal (cat) (dog) hair and dander: Secondary | ICD-10-CM | POA: Diagnosis not present

## 2018-05-08 DIAGNOSIS — J301 Allergic rhinitis due to pollen: Secondary | ICD-10-CM | POA: Diagnosis not present

## 2018-05-16 DIAGNOSIS — J301 Allergic rhinitis due to pollen: Secondary | ICD-10-CM | POA: Diagnosis not present

## 2018-05-16 DIAGNOSIS — J3089 Other allergic rhinitis: Secondary | ICD-10-CM | POA: Diagnosis not present

## 2018-05-16 DIAGNOSIS — J3081 Allergic rhinitis due to animal (cat) (dog) hair and dander: Secondary | ICD-10-CM | POA: Diagnosis not present

## 2018-05-22 DIAGNOSIS — L578 Other skin changes due to chronic exposure to nonionizing radiation: Secondary | ICD-10-CM | POA: Diagnosis not present

## 2018-05-22 DIAGNOSIS — D485 Neoplasm of uncertain behavior of skin: Secondary | ICD-10-CM | POA: Diagnosis not present

## 2018-05-22 DIAGNOSIS — Z1283 Encounter for screening for malignant neoplasm of skin: Secondary | ICD-10-CM | POA: Diagnosis not present

## 2018-06-03 ENCOUNTER — Other Ambulatory Visit: Payer: 59

## 2018-06-03 ENCOUNTER — Other Ambulatory Visit: Payer: Self-pay

## 2018-06-03 ENCOUNTER — Other Ambulatory Visit: Payer: Self-pay | Admitting: Neurosurgery

## 2018-06-03 ENCOUNTER — Ambulatory Visit
Admission: RE | Admit: 2018-06-03 | Discharge: 2018-06-03 | Disposition: A | Discharge status: Home / Self Care | Payer: 59 | Source: Ambulatory Visit | Attending: Neurosurgery | Admitting: Neurosurgery

## 2018-06-03 DIAGNOSIS — M5137 Other intervertebral disc degeneration, lumbosacral region: Secondary | ICD-10-CM

## 2018-06-03 DIAGNOSIS — M4727 Other spondylosis with radiculopathy, lumbosacral region: Secondary | ICD-10-CM | POA: Diagnosis not present

## 2018-06-03 MED ORDER — METHYLPREDNISOLONE ACETATE 40 MG/ML INJ SUSP (RADIOLOG: 120.0000 mg | Freq: Once | INTRAMUSCULAR | Status: DC

## 2018-06-03 MED ORDER — IOPAMIDOL (ISOVUE-M 200) INJECTION 41%: 1.0000 mL | Freq: Once | INTRAMUSCULAR | Status: DC

## 2018-06-30 ENCOUNTER — Other Ambulatory Visit: Payer: Self-pay | Admitting: Neurosurgery

## 2018-06-30 DIAGNOSIS — M5440 Lumbago with sciatica, unspecified side: Secondary | ICD-10-CM

## 2018-07-03 ENCOUNTER — Encounter: Payer: Self-pay | Admitting: Internal Medicine

## 2018-07-03 ENCOUNTER — Ambulatory Visit (INDEPENDENT_AMBULATORY_CARE_PROVIDER_SITE_OTHER): Payer: 59 | Admitting: Internal Medicine

## 2018-07-03 ENCOUNTER — Other Ambulatory Visit: Payer: Self-pay

## 2018-07-03 VITALS — Ht 69.0 in

## 2018-07-03 DIAGNOSIS — A6920 Lyme disease, unspecified: Secondary | ICD-10-CM

## 2018-07-03 DIAGNOSIS — N951 Menopausal and female climacteric states: Secondary | ICD-10-CM | POA: Diagnosis not present

## 2018-07-03 DIAGNOSIS — W57XXXA Bitten or stung by nonvenomous insect and other nonvenomous arthropods, initial encounter: Secondary | ICD-10-CM

## 2018-07-03 DIAGNOSIS — N952 Postmenopausal atrophic vaginitis: Secondary | ICD-10-CM | POA: Diagnosis not present

## 2018-07-03 DIAGNOSIS — S30861A Insect bite (nonvenomous) of abdominal wall, initial encounter: Secondary | ICD-10-CM | POA: Diagnosis not present

## 2018-07-03 MED ORDER — DOXYCYCLINE MONOHYDRATE 100 MG PO CAPS: 100.0000 mg | ORAL_CAPSULE | Freq: Two times a day (BID) | ORAL | 0 refills | Status: DC

## 2018-07-03 NOTE — Patient Instructions (Signed)

## 2018-07-06 ENCOUNTER — Encounter: Payer: Self-pay | Admitting: Internal Medicine

## 2018-07-06 NOTE — Progress Notes (Signed)
Virtual Visit via Video   This visit type was conducted due to national recommendations for restrictions regarding the COVID-19 Pandemic (e.g. social distancing) in an effort to limit this patient's exposure and mitigate transmission in our community.  Due to her co-morbid illnesses, this patient is at least at moderate risk for complications without adequate follow up.  This format is felt to be most appropriate for this patient at this time.  All issues noted in this document were discussed and addressed.  A limited physical exam was performed with this format.    This visit type was conducted due to national recommendations for restrictions regarding the COVID-19 Pandemic (e.g. social distancing) in an effort to limit this patient's exposure and mitigate transmission in our community.  Patients identity confirmed using two different identifiers.  This format is felt to be most appropriate for this patient at this time.  All issues noted in this document were discussed and addressed.  No physical exam was performed (except for noted visual exam findings with Video Visits).    Date:  07/06/2018   ID:  Michelle Mccarthy, DOB 08/23/1960, MRN [id number]  Patient Location:  Home  Provider location:   Office    Chief Complaint:  Bhrt f/u  History of Present Illness:    Michelle Mccarthy is a 58 y.o. female who presents via video conferencing for a telehealth visit today.    The patient does not have symptoms concerning for COVID-19 infection (fever, chills, cough, or new shortness of breath).   She presents today for virtual visit. She prefers this method of contact due to COVID-19 pandemic.  She presents today for f/u BHRT.  She reports compliance with both progesterone/dhea capsules and estriol/testosterone vaginal cream. She has not had any issues with the medication.    Additionally, she presents for further evaluation of a rash. She reports she found a tick on her about a week or so ago. She  removed it. She has not felt any increased joint pains,etc. However, she has a rash that has progressively worsened over the past few days.   Past Medical History:  Diagnosis Date  . Hx of basal cell carcinoma 09/18/2016   multiple sites   . Hx of dysplastic nevus 06/11/2017   multiple sites  . Hx of migraines   . Insomnia   . 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 Postmenopausal atrophic vaginitis    History reviewed. No pertinent surgical history.   Current Meds  Medication Sig  . albuterol (PROVENTIL HFA;VENTOLIN HFA) 108 (90 Base) MCG/ACT inhaler   . azelastine (ASTELIN) 0.1 % nasal spray USE 1 SPRAY IN EACH NOSTRIL TWICE A DAY NASALLY 30 DAY  . Cholecalciferol (VITAMIN D3) 5000 units TABS Take 1 capsule by mouth once.  . desloratadine (CLARINEX) 5 MG tablet Take 5 mg by mouth daily.  Marland Kitchen Galcanezumab-gnlm (EMGALITY) 120 MG/ML SOAJ Inject into the skin. One injection monthly  . Magnesium 250 MG TABS Take by mouth. 1 tablet daily  . Multiple Vitamin (MULTIVITAMIN) tablet Take 1 tablet by mouth daily.  Marland Kitchen POTASSIUM CHLORIDE PO Take 1 tablet by mouth once. 550 mg daily  . vitamin E 1000 UNIT capsule Take 400 Units by mouth daily.      Allergies:   Patient has no known allergies.   Social History   Tobacco Use  . Smoking status: Never Smoker  . Smokeless tobacco: Never Used  Substance Use Topics  . Alcohol use: Never  Frequency: Never  . Drug use: Never     Family Hx: The patient's family history includes Dementia in her mother; Healthy in her father. There is no history of Breast cancer.  ROS:   Please see the history of present illness.    Review of Systems  Constitutional: Negative.   Respiratory: Negative.   Cardiovascular: Negative.   Gastrointestinal: Negative.   Neurological: Negative.   Psychiatric/Behavioral: Negative.     All other systems reviewed and are negative.   Labs/Other Tests and Data Reviewed:    Recent Labs: 11/06/2017: ALT 28  04/10/2018: Hemoglobin 13.2; Platelets 316   Recent Lipid Panel No results found for: CHOL, TRIG, HDL, CHOLHDL, LDLCALC, LDLDIRECT  Wt Readings from Last 3 Encounters:  03/11/18 170 lb 6.4 oz (77.3 kg)  11/06/17 168 lb 12.8 oz (76.6 kg)  10/18/17 168 lb (76.2 kg)     Exam:    Vital Signs:  Ht 5\' 9"  (1.753 m)   LMP 04/06/2014   BMI 25.16 kg/m     Physical Exam  Constitutional: She is oriented to person, place, and time and well-developed, well-nourished, and in no distress.  HENT:  Head: Normocephalic and atraumatic.  Neck: Normal range of motion.  Pulmonary/Chest: Effort normal.  Neurological: She is alert and oriented to person, place, and time.  Skin: Rash noted. There is erythema.  Confluent, round erythematous rash on right upper abdomen. No target lesion noted.   Psychiatric: Affect normal.  Nursing note and vitals reviewed.   ASSESSMENT & PLAN:     1. Female climacteric state  Her symptoms have improved with use of BHRT. She will continue with current regimen. She will rto in 4 months for re-evaluation.   2. Postmenopausal atrophic vaginitis  Chronic, yet improved. She will continue with use of estriol/testosterone vaginally three days per week.   3. Tick bite of abdominal wall, initial encounter  Occurred over 7 days ago.  She was given rx doxycycline 100mg  twice daily.. She is encouraged to complete full abx course.   4. Erythema Migrans  Although this was not a classic target lesion, it is possible for a uniformly red lesion to be due to Lyme as well. She will let me know if her sx persist.     COVID-19 Education: The signs and symptoms of COVID-19 were discussed with the patient and how to seek care for testing (follow up with PCP or arrange E-visit).  The importance of social distancing was discussed today.  Patient Risk:   After full review of this patients clinical status, I feel that they are at least moderate risk at this time.  Time:   Today, I  have spent 10 minutes/ 30 seconds with the patient with telehealth technology discussing above diagnoses.     Medication Adjustments/Labs and Tests Ordered: Current medicines are reviewed at length with the patient today.  Concerns regarding medicines are outlined above.   Tests Ordered: No orders of the defined types were placed in this encounter.   Medication Changes: Meds ordered this encounter  Medications  . doxycycline (MONODOX) 100 MG capsule    Sig: Take 1 capsule (100 mg total) by mouth 2 (two) times daily.    Dispense:  14 capsule    Refill:  0    Disposition:  Follow up in 4 month(s)  Signed, [name], MD

## 2018-07-08 ENCOUNTER — Other Ambulatory Visit: Payer: Self-pay | Admitting: Obstetrics & Gynecology

## 2018-07-09 ENCOUNTER — Ambulatory Visit: Payer: Self-pay | Admitting: Internal Medicine

## 2018-07-09 ENCOUNTER — Other Ambulatory Visit: Payer: Self-pay | Admitting: Internal Medicine

## 2018-07-09 MED ORDER — DOXYCYCLINE MONOHYDRATE 100 MG PO CAPS: 100.0000 mg | ORAL_CAPSULE | Freq: Two times a day (BID) | ORAL | 0 refills | Status: DC

## 2018-07-14 ENCOUNTER — Ambulatory Visit
Admission: RE | Admit: 2018-07-14 | Discharge: 2018-07-14 | Disposition: A | Discharge status: Home / Self Care | Payer: 59 | Source: Ambulatory Visit | Attending: Neurosurgery | Admitting: Neurosurgery

## 2018-07-14 ENCOUNTER — Other Ambulatory Visit: Payer: Self-pay | Admitting: Neurosurgery

## 2018-07-14 ENCOUNTER — Other Ambulatory Visit: Payer: Self-pay

## 2018-07-14 DIAGNOSIS — M5440 Lumbago with sciatica, unspecified side: Secondary | ICD-10-CM

## 2018-07-14 MED ORDER — IOPAMIDOL (ISOVUE-M 200) INJECTION 41%
1.0000 mL | Freq: Once | INTRAMUSCULAR | Status: AC
  Administered 2018-07-14: 1 mL via EPIDURAL

## 2018-07-14 MED ORDER — METHYLPREDNISOLONE ACETATE 40 MG/ML INJ SUSP (RADIOLOG
120.0000 mg | Freq: Once | INTRAMUSCULAR | Status: AC
  Administered 2018-07-14: 08:00:00 120 mg via EPIDURAL

## 2018-07-14 NOTE — Discharge Instructions (Signed)

## 2018-07-21 ENCOUNTER — Ambulatory Visit: Payer: 59 | Admitting: Obstetrics & Gynecology

## 2018-07-25 ENCOUNTER — Telehealth: Payer: 59 | Admitting: Family

## 2018-07-25 DIAGNOSIS — W5501XA Bitten by cat, initial encounter: Secondary | ICD-10-CM

## 2018-07-25 NOTE — Progress Notes (Signed)
Based on what you shared with me, I feel your condition warrants further evaluation and I recommend that you be seen for a face to face office visit.  Cat bites can become complicated and additional information is required above what we can complete through an e-visit.   NOTE: If you entered your credit card information for this eVisit, you will not be charged. You may see a "hold" on your card for the $35 but that hold will drop off and you will not have a charge processed.  If you are having a true medical emergency please call 911.     For an urgent face to face visit, [facility] has five urgent care centers for your convenience:    [email or web address] to reserve your spot online an avoid wait times  Michelle Mccarthy (New Address!) [address], [place], [place] [address] *Just off [address], across the road from [place] hours of operation: Monday-Friday, 12 PM to 6 PM  Closed Saturday & Sunday   The following sites will take your insurance:  . [facility]    [phone]                  Get Driving Directions  [address], [place] [address] . 10 am to 8 pm Monday-Friday . 12 pm to 8 pm Saturday-Sunday   . [facility] at [place]                  Get Driving Directions  [address], [place], [place] [address] . 8 am to 8 pm Monday-Friday . 9 am to 6 pm Saturday . 11 am to 6 pm Sunday   . [facility] at [place]                  Get Driving Directions   [address], [place] [address] . 8 am to 8 pm Monday-Friday . 8 am to 4 pm Saturday-Sunday    . [facility] at [place]                    Get Driving Directions  [phone]  [address], [place], [place] [address]  . Monday-Friday, 12 PM to 6 PM    Your e-visit answers were reviewed  by a board certified advanced clinical practitioner to complete your personal care plan.  Thank you for using e-Visits.

## 2018-07-28 ENCOUNTER — Encounter: Payer: Self-pay | Admitting: Internal Medicine

## 2018-07-28 ENCOUNTER — Other Ambulatory Visit: Payer: Self-pay

## 2018-07-28 ENCOUNTER — Ambulatory Visit (INDEPENDENT_AMBULATORY_CARE_PROVIDER_SITE_OTHER): Payer: 59 | Admitting: Internal Medicine

## 2018-07-28 DIAGNOSIS — L03115 Cellulitis of right lower limb: Secondary | ICD-10-CM

## 2018-07-28 DIAGNOSIS — S81831A Puncture wound without foreign body, right lower leg, initial encounter: Secondary | ICD-10-CM | POA: Diagnosis not present

## 2018-07-28 DIAGNOSIS — W5501XA Bitten by cat, initial encounter: Secondary | ICD-10-CM

## 2018-07-28 DIAGNOSIS — S81851A Open bite, right lower leg, initial encounter: Secondary | ICD-10-CM

## 2018-07-28 DIAGNOSIS — Z23 Encounter for immunization: Secondary | ICD-10-CM | POA: Diagnosis not present

## 2018-07-28 MED ORDER — AMOXICILLIN-POT CLAVULANATE 875-125 MG PO TABS: 1.0000 | ORAL_TABLET | Freq: Two times a day (BID) | ORAL | 0 refills | Status: AC

## 2018-07-28 MED ORDER — TETANUS-DIPHTH-ACELL PERTUSSIS 5-2.5-18.5 LF-MCG/0.5 IM SUSP: 0.5000 mL | Freq: Once | INTRAMUSCULAR | 0 refills | Status: AC

## 2018-07-28 NOTE — Progress Notes (Signed)
Virtual Visit via Video   This visit type was conducted due to national recommendations for restrictions regarding the COVID-19 Pandemic (e.g. social distancing) in an effort to limit this patient's exposure and mitigate transmission in our community.  Due to her co-morbid illnesses, this patient is at least at moderate risk for complications without adequate follow up.  This format is felt to be most appropriate for this patient at this time.  All issues noted in this document were discussed and addressed.  A limited physical exam was performed with this format.    This visit type was conducted due to national recommendations for restrictions regarding the COVID-19 Pandemic (e.g. social distancing) in an effort to limit this patient's exposure and mitigate transmission in our community.  Patients identity confirmed using two different identifiers.  This format is felt to be most appropriate for this patient at this time.  All issues noted in this document were discussed and addressed.  No physical exam was performed (except for noted visual exam findings with Video Visits).    Date:  07/28/2018   ID:  Michelle Mccarthy, DOB 07-12-1960, MRN [id number]  Patient Location:  Home  Provider location:   Office    Chief Complaint:  I have a cat bite.   History of Present Illness:    Michelle Mccarthy is a 58 y.o. female who presents via video conferencing for a telehealth visit today.    The patient does not have symptoms concerning for COVID-19 infection (fever, chills, cough, or new shortness of breath).   She presents today for virtual visit. She prefers this method of contact due to COVID-19 pandemic.  She states her cat "attacked her" last week. She was bitten last Thursday afternoon. She did not seek immediate medical attention. Instead, she decided to take some of her husband's doxycycline. She called today because the area has not improved. She was bitten on her right lower leg.   Animal  Bite  The incident occurred more than 2 days ago. The incident occurred at home. There is an injury to the right lower leg. Associated symptoms include pain when bearing weight. Pertinent negatives include no decreased responsiveness, no loss of consciousness and no tingling. There have been no prior injuries to these areas. Her tetanus status is unknown. She has received no recent medical care.     Past Medical History:  Diagnosis Date  . Hx of basal cell carcinoma 09/18/2016   multiple sites   . Hx of dysplastic nevus 06/11/2017   multiple sites  . Hx of migraines   . Insomnia   . 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 Postmenopausal atrophic vaginitis    No past surgical history on file.   Current Meds  Medication Sig  . albuterol (PROVENTIL HFA;VENTOLIN HFA) 108 (90 Base) MCG/ACT inhaler   . azelastine (ASTELIN) 0.1 % nasal spray USE 1 SPRAY IN EACH NOSTRIL TWICE A DAY NASALLY 30 DAY  . Cholecalciferol (VITAMIN D3) 5000 units TABS Take 1 capsule by mouth once.  . desloratadine (CLARINEX) 5 MG tablet Take 5 mg by mouth daily.  Marland Kitchen Galcanezumab-gnlm (EMGALITY) 120 MG/ML SOAJ Inject into the skin. One injection monthly  . Magnesium 250 MG TABS Take by mouth. 1 tablet daily  . Multiple Vitamin (MULTIVITAMIN) tablet Take 1 tablet by mouth daily.  Marland Kitchen POTASSIUM CHLORIDE PO Take 1 tablet by mouth once. 550 mg daily  . vitamin E 1000 UNIT capsule Take 400 Units by mouth daily.  Allergies:   Patient has no known allergies.   Social History   Tobacco Use  . Smoking status: Never Smoker  . Smokeless tobacco: Never Used  Substance Use Topics  . Alcohol use: Never    Frequency: Never  . Drug use: Never     Family Hx: The patient's family history includes Dementia in her mother; Healthy in her father. There is no history of Breast cancer.  ROS:   Please see the history of present illness.    Review of Systems  Constitutional: Negative.  Negative for decreased  responsiveness.  Respiratory: Negative.   Cardiovascular: Negative.   Gastrointestinal: Negative.   Neurological: Negative.  Negative for tingling and loss of consciousness.  Psychiatric/Behavioral: Negative.     All other systems reviewed and are negative.   Labs/Other Tests and Data Reviewed:    Recent Labs: 11/06/2017: ALT 28 04/10/2018: Hemoglobin 13.2; Platelets 316   Recent Lipid Panel No results found for: CHOL, TRIG, HDL, CHOLHDL, LDLCALC, LDLDIRECT  Wt Readings from Last 3 Encounters:  03/11/18 170 lb 6.4 oz (77.3 kg)  11/06/17 168 lb 12.8 oz (76.6 kg)  10/18/17 168 lb (76.2 kg)     Exam:    Vital Signs:  LMP 04/06/2014     Physical Exam  Constitutional: She is oriented to person, place, and time and well-developed, well-nourished, and in no distress.  HENT:  Head: Normocephalic and atraumatic.  Neck: Normal range of motion.  Pulmonary/Chest: Effort normal.  Neurological: She is alert and oriented to person, place, and time.  Skin:  Two puncture wounds on RLE There is surrounding erythema  Psychiatric: Affect normal.  Nursing note and vitals reviewed.   ASSESSMENT & PLAN:     1. Cat bite of right lower leg, initial encounter  She was given rx augmentin 875mg  to take twice daily x 10 days. She is encouraged to take full abx course. Pt advised to seek treatment SAME DAY of "cat attack" in the future to reduce her risk of complications. She will send a photo of her right lower extremity today. She is advised to send another photo on Wednesday. Pt advised that she should call sooner if she feels her sx are worsening. All questions were answered to her satisfaction.   2. Cellulitis of right lower extremity  Again, pt advised to contact MD on call if she has similar situation in the future. Unfortunately, I think this has developed because she did not get treated until TODAY, 96 hours or so after the initial incident.    3. Need for vaccination  Rx Boostrix  (Tdap) was sent to the pharmacy since she does not wish to come into the office.    COVID-19 Education: The signs and symptoms of COVID-19 were discussed with the patient and how to seek care for testing (follow up with PCP or arrange E-visit).  The importance of social distancing was discussed today.  Patient Risk:   After full review of this patients clinical status, I feel that they are at least moderate risk at this time.  Time:   Today, I have spent 8 minutes/ 10 seconds with the patient with telehealth technology discussing above diagnoses.     Medication Adjustments/Labs and Tests Ordered: Current medicines are reviewed at length with the patient today.  Concerns regarding medicines are outlined above.   Tests Ordered: No orders of the defined types were placed in this encounter.   Medication Changes: Meds ordered this encounter  Medications  . amoxicillin-clavulanate (  AUGMENTIN) 875-125 MG tablet    Sig: Take 1 tablet by mouth 2 (two) times daily for 10 days.    Dispense:  20 tablet    Refill:  0    Disposition:  Follow up prn  Signed, [name], MD

## 2018-07-28 NOTE — Patient Instructions (Signed)
Animal Bite, Adult Animal bite wounds can be mild or serious. It is important to get medical treatment to prevent infection. Ask your doctor if you need treatment to prevent an infection that can spread from animals to humans (rabies). Follow these instructions at home: Wound care   Follow instructions from your doctor about how to take care of your wound. Make sure you: ? Wash your hands with soap and water before you change your bandage (dressing). If you cannot use soap and water, use hand sanitizer. ? Change your bandage as told by your doctor. ? Leave stitches (sutures), skin glue, or skin tape (adhesive) strips in place. They may need to stay in place for 2 weeks or longer. If tape strips get loose and curl up, you may trim the loose edges. Do not remove tape strips completely unless your doctor says it is okay.  Check your wound every day for signs of infection. Check for: ? More redness, swelling, or pain. ? More fluid or blood. ? Warmth. ? Pus or a bad smell. Medicines  Take or apply over-the-counter and prescription medicines only as told by your doctor.  If you were prescribed an antibiotic, take or apply it as told by your doctor. Do not stop using the antibiotic even if your wound gets better. General instructions   Keep the injured area raised (elevated) above the level of your heart while you are sitting or lying down.  If directed, put ice on the injured area. ? Put ice in a plastic bag. ? Place a towel between your skin and the bag. ? Leave the ice on for 20 minutes, 2-3 times per day.  Keep all follow-up visits as told by your doctor. This is important. Contact a doctor if:  You have more redness, swelling, or pain around your wound.  Your wound feels warm to the touch.  You have a fever or chills.  You have a general feeling of sickness (malaise).  You feel sick to your stomach (nauseous).  You throw up (vomit).  You have pain that does not get better.  Get help right away if:  You have a red streak going away from your wound.  You have any of these coming from your wound: ? Non-clear fluid. ? More blood. ? Pus or a bad smell.  You have trouble moving your injured area.  You lose feeling (have numbness) or feel tingling anywhere on your body. Summary  It is important to get the right medical treatment for animal bites. Treatment can help you to not get an infection. Ask your doctor if you need treatment to prevent an infection that can spread from animals to humans (rabies).  Check your wound every day for signs of infection, such as more redness or swelling instead of less.  If you have a red streak going away from your wound, get medical help right away. This information is not intended to replace advice given to you by your health care provider. Make sure you discuss any questions you have with your health care provider. Document Released: 12/25/2004 Document Revised: 12/20/2016 Document Reviewed: 07/05/2016 Elsevier Patient Education  2020 Elsevier Inc[employer]  

## 2018-07-29 ENCOUNTER — Other Ambulatory Visit: Payer: Self-pay | Admitting: Internal Medicine

## 2018-08-08 ENCOUNTER — Encounter: Payer: Self-pay | Admitting: Obstetrics & Gynecology

## 2018-08-08 ENCOUNTER — Ambulatory Visit (INDEPENDENT_AMBULATORY_CARE_PROVIDER_SITE_OTHER): Payer: 59 | Admitting: Obstetrics & Gynecology

## 2018-08-08 ENCOUNTER — Other Ambulatory Visit: Payer: Self-pay

## 2018-08-08 ENCOUNTER — Other Ambulatory Visit (HOSPITAL_COMMUNITY)
Admission: RE | Admit: 2018-08-08 | Discharge: 2018-08-08 | Disposition: A | Discharge status: Home / Self Care | Payer: 59 | Source: Ambulatory Visit | Attending: Obstetrics & Gynecology | Admitting: Obstetrics & Gynecology

## 2018-08-08 VITALS — BP 110/60 | HR 88 | Ht 69.0 in | Wt 170.0 lb

## 2018-08-08 DIAGNOSIS — Z124 Encounter for screening for malignant neoplasm of cervix: Secondary | ICD-10-CM | POA: Diagnosis not present

## 2018-08-08 DIAGNOSIS — Z01419 Encounter for gynecological examination (general) (routine) without abnormal findings: Secondary | ICD-10-CM | POA: Diagnosis not present

## 2018-08-08 DIAGNOSIS — Z1211 Encounter for screening for malignant neoplasm of colon: Secondary | ICD-10-CM

## 2018-08-08 DIAGNOSIS — Z1239 Encounter for other screening for malignant neoplasm of breast: Secondary | ICD-10-CM

## 2018-08-08 NOTE — Progress Notes (Signed)
HPI:      [name] is a 58 y.o. G1P0010 who LMP was in the past, she presents today for her annual examination.  The patient has no complaints today. The patient is sexually active. Herlast pap: approximate date 2017 and was normal and last mammogram: approximate date 2019 and was normal.  The patient does perform self breast exams.  There is notable family history of breast or ovarian cancer in her family. The patient is taking hormone replacement therapy. Patient denies post-menopausal vaginal bleeding.   The patient has regular exercise: yes. The patient denies current symptoms of depression.    GYN Hx: Last Colonoscopy:3 years ago. Normal.  Last DEXA: never ago.    PMHx: Past Medical History:  Diagnosis Date  . Hx of basal cell carcinoma 09/18/2016   multiple sites   . Hx of dysplastic nevus 06/11/2017   multiple sites  . Hx of migraines   . Insomnia   . 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 Postmenopausal atrophic vaginitis    Past Surgical History:  Procedure Laterality Date  . BACK SURGERY  2016   Microdiskectomy   Family History  Problem Relation Age of Onset  . Dementia Mother   . Healthy Father   . Breast cancer Neg Hx    Social History   Tobacco Use  . Smoking status: Never Smoker  . Smokeless tobacco: Never Used  Substance Use Topics  . Alcohol use: Never    Frequency: Never  . Drug use: Never    Current Outpatient Medications:  .  albuterol (PROVENTIL HFA;VENTOLIN HFA) 108 (90 Base) MCG/ACT inhaler, , Disp: , Rfl:  .  azelastine (ASTELIN) 0.1 % nasal spray, USE 1 SPRAY IN EACH NOSTRIL TWICE A DAY NASALLY 30 DAY, Disp: , Rfl: 5 .  Cholecalciferol (VITAMIN D3) 5000 units TABS, Take 1 capsule by mouth once., Disp: , Rfl:  .  desloratadine (CLARINEX) 5 MG tablet, Take 5 mg by mouth daily., Disp: , Rfl:  .  Galcanezumab-gnlm (EMGALITY) 120 MG/ML SOAJ, Inject into the skin. One injection monthly, Disp: , Rfl:  .  Magnesium 250 MG TABS, Take by  mouth. 1 tablet daily, Disp: , Rfl:  .  Multiple Vitamin (MULTIVITAMIN) tablet, Take 1 tablet by mouth daily., Disp: , Rfl:  .  POTASSIUM CHLORIDE PO, Take 1 tablet by mouth once. 550 mg daily, Disp: , Rfl:  .  vitamin E 1000 UNIT capsule, Take 400 Units by mouth daily. , Disp: , Rfl:  Allergies: Patient has no known allergies.  Review of Systems  Constitutional: Negative for chills, fever and malaise/fatigue.  HENT: Negative for congestion, sinus pain and sore throat.   Eyes: Negative for blurred vision and pain.  Respiratory: Negative for cough and wheezing.   Cardiovascular: Negative for chest pain and leg swelling.  Gastrointestinal: Negative for abdominal pain, constipation, diarrhea, heartburn, nausea and vomiting.  Genitourinary: Negative for dysuria, frequency, hematuria and urgency.  Musculoskeletal: Negative for back pain, joint pain, myalgias and neck pain.  Skin: Negative for itching and rash.  Neurological: Negative for dizziness, tremors and weakness.  Endo/Heme/Allergies: Does not bruise/bleed easily.  Psychiatric/Behavioral: Negative for depression. The patient is not nervous/anxious and does not have insomnia.     Objective: BP 110/60 (BP Location: Left Arm, Patient Position: Sitting)   Pulse 88   Ht 5\' 9"  (1.753 m)   Wt 170 lb (77.1 kg)   LMP 04/06/2014   BMI 25.10 kg/m   [employer]  08/08/18 1338  Weight: 170 lb (77.1 kg)   Body mass index is 25.1 kg/m. Physical Exam Constitutional:      General: She is not in acute distress.    Appearance: She is well-developed.  Genitourinary:     Pelvic exam was performed with patient supine.     Vagina, uterus and rectum normal.     No lesions in the vagina.     No vaginal bleeding.     No cervical motion tenderness, friability, lesion or polyp.     Uterus is mobile.     Uterus is not enlarged.     No uterine mass detected.    Uterus is midaxial.     No right or left adnexal mass present.     Right adnexa  not tender.     Left adnexa not tender.  HENT:     Head: Normocephalic and atraumatic. No laceration.     Right Ear: Hearing normal.     Left Ear: Hearing normal.     Mouth/Throat:     Pharynx: Uvula midline.  Eyes:     Pupils: Pupils are equal, round, and reactive to light.  Neck:     Musculoskeletal: Normal range of motion and neck supple.     Thyroid: No thyromegaly.  Cardiovascular:     Rate and Rhythm: Normal rate and regular rhythm.     Heart sounds: No murmur. No friction rub. No gallop.   Pulmonary:     Effort: Pulmonary effort is normal. No respiratory distress.     Breath sounds: Normal breath sounds. No wheezing.  Chest:     Breasts:        Right: No mass, skin change or tenderness.        Left: No mass, skin change or tenderness.  Abdominal:     General: Bowel sounds are normal. There is no distension.     Palpations: Abdomen is soft.     Tenderness: There is no abdominal tenderness. There is no rebound.  Musculoskeletal: Normal range of motion.  Neurological:     Mental Status: She is alert and oriented to person, place, and time.     Cranial Nerves: No cranial nerve deficit.  Skin:    General: Skin is warm and dry.  Psychiatric:        Judgment: Judgment normal.  Vitals signs reviewed.    Assessment: Annual Exam 1. Women's annual routine gynecological examination   2. Screen for colon cancer   3. Screening for cervical cancer   4. Screening for breast cancer    Plan:            1.  Cervical Screening-  Pap smear done today  2. Breast screening- Exam annually and mammogram scheduled  3. Colonoscopy every 3-5 years based on family history, Hemoccult testing after age 75  4. Labs managed by PCP  5. Counseling for hormonal therapy: no change in therapy today Continues w BHRT thru [place] [place]              6. FRAX - FRAX score for assessing the 10 year probability for fracture calculated and discussed today.  Based on age and score today,  DEXA is not currently scheduled.    F/U  Return in about 1 year (around 08/08/2019) for Annual.  Barnett Applebaum, MD, [facility], [facility] 08/08/2018  2:02 PM

## 2018-08-08 NOTE — Patient Instructions (Signed)
PAP every three years Mammogram every year    Call [phone] to schedule at [facility] Colonoscopy every 3-5 years Labs yearly (with PCP)

## 2018-08-13 LAB — CYTOLOGY - PAP
Diagnosis: NEGATIVE
HPV: NOT DETECTED

## 2018-08-27 ENCOUNTER — Other Ambulatory Visit: Payer: Self-pay | Admitting: Neurosurgery

## 2018-08-27 DIAGNOSIS — M5137 Other intervertebral disc degeneration, lumbosacral region: Secondary | ICD-10-CM

## 2018-09-16 ENCOUNTER — Other Ambulatory Visit: Payer: Self-pay

## 2018-09-16 ENCOUNTER — Ambulatory Visit
Admission: RE | Admit: 2018-09-16 | Discharge: 2018-09-16 | Disposition: A | Discharge status: Home / Self Care | Payer: 59 | Source: Ambulatory Visit | Attending: Neurosurgery | Admitting: Neurosurgery

## 2018-09-16 DIAGNOSIS — M5137 Other intervertebral disc degeneration, lumbosacral region: Secondary | ICD-10-CM

## 2018-09-16 MED ORDER — IOPAMIDOL (ISOVUE-M 200) INJECTION 41%
1.0000 mL | Freq: Once | INTRAMUSCULAR | Status: AC
  Administered 2018-09-16: 1 mL via EPIDURAL

## 2018-09-16 MED ORDER — METHYLPREDNISOLONE ACETATE 40 MG/ML INJ SUSP (RADIOLOG
120.0000 mg | Freq: Once | INTRAMUSCULAR | Status: AC
  Administered 2018-09-16: 120 mg via INTRA_ARTICULAR

## 2018-09-16 NOTE — Discharge Instructions (Signed)

## 2018-09-17 ENCOUNTER — Encounter: Payer: Self-pay | Admitting: Obstetrics and Gynecology

## 2018-09-19 ENCOUNTER — Ambulatory Visit
Admission: RE | Admit: 2018-09-19 | Discharge: 2018-09-19 | Disposition: A | Discharge status: Home / Self Care | Payer: 59 | Source: Ambulatory Visit | Attending: Obstetrics & Gynecology | Admitting: Obstetrics & Gynecology

## 2018-09-19 DIAGNOSIS — Z1231 Encounter for screening mammogram for malignant neoplasm of breast: Secondary | ICD-10-CM | POA: Diagnosis not present

## 2018-09-19 DIAGNOSIS — Z1239 Encounter for other screening for malignant neoplasm of breast: Secondary | ICD-10-CM

## 2018-10-07 IMAGING — XA Imaging study
2 series · 2 of 2 positions shown · non-contrast
Comparison: none

CLINICAL DATA: Lumbosacral spondylosis without myelopathy. The
patient reports good relief from the prior L5 nerve root blocks,
although the duration of relief is only approximately 1.5 months.
Pain has worsened in the low back and is now also located on the
right extending towards the hip. Given the bilateral symptoms, an
interlaminar injection will be performed today instead of a
selective nerve root block.

[Series 1: ortho standard · 1 of 1 slices shown (1 of 2)]
[im 1/1]
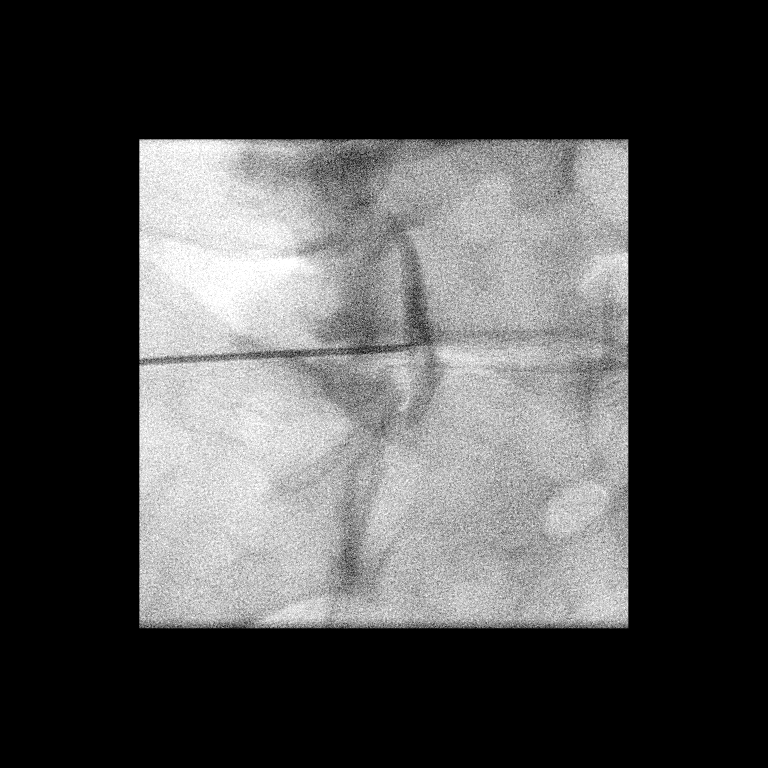

[Series 2: ortho standard · 1 of 1 slices shown (2 of 2)]
[im 1/1]
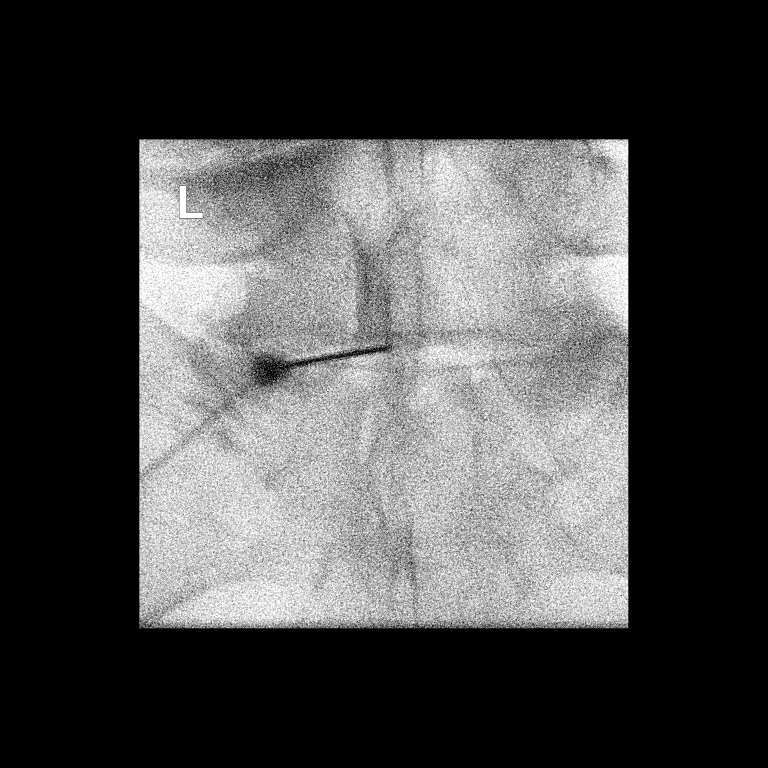

[2 of 2 positions shown; findings below may reference images not displayed]

FLUOROSCOPY TIME:  Radiation Exposure Index (as provided by the
fluoroscopic device): 8.54 microGray*m^2

Fluoroscopy Time (in minutes and seconds):  8 seconds

PROCEDURE:
The procedure, risks, benefits, and alternatives were explained to
the patient. Questions regarding the procedure were encouraged and
answered. The patient understands and consents to the procedure.

LUMBAR EPIDURAL INJECTION:

An interlaminar approach was performed on the left at L5-S1. The
overlying skin was cleansed and anesthetized. A 3.5 inch 20 gauge
epidural needle was advanced using loss-of-resistance technique.

DIAGNOSTIC EPIDURAL INJECTION:

Injection of Isovue-M 200 shows a good epidural pattern with spread
above and below the level of needle placement, primarily on the
left. No vascular opacification is seen.

THERAPEUTIC EPIDURAL INJECTION:

120 mg of Depo-Medrol mixed with 3 mL of 1% lidocaine were
instilled. The procedure was well-tolerated. The patient experienced
numbness in both feet afterward which slowly resolved during an
extended period of observation.

COMPLICATIONS:
None
IMPRESSION: Technically successful lumbar interlaminar epidural injection on the
left at L5-S1.

## 2018-11-05 ENCOUNTER — Other Ambulatory Visit: Payer: Self-pay | Admitting: Neurosurgery

## 2018-11-05 DIAGNOSIS — M5440 Lumbago with sciatica, unspecified side: Secondary | ICD-10-CM

## 2018-11-12 ENCOUNTER — Ambulatory Visit: Payer: 59 | Admitting: Internal Medicine

## 2018-11-12 ENCOUNTER — Other Ambulatory Visit: Payer: Self-pay

## 2018-11-12 ENCOUNTER — Encounter: Payer: Self-pay | Admitting: Internal Medicine

## 2018-11-12 VITALS — BP 114/80 | HR 50 | Temp 98.1°F | Ht 69.0 in | Wt 171.0 lb

## 2018-11-12 DIAGNOSIS — M48062 Spinal stenosis, lumbar region with neurogenic claudication: Secondary | ICD-10-CM

## 2018-11-12 DIAGNOSIS — Z79899 Other long term (current) drug therapy: Secondary | ICD-10-CM | POA: Diagnosis not present

## 2018-11-12 DIAGNOSIS — N951 Menopausal and female climacteric states: Secondary | ICD-10-CM

## 2018-11-12 DIAGNOSIS — Z6825 Body mass index (BMI) 25.0-25.9, adult: Secondary | ICD-10-CM | POA: Diagnosis not present

## 2018-11-12 NOTE — Patient Instructions (Addendum)
PRP therapy   Dr. Neomia Dear, [facility]  Dr. Richrd Prime - brain health   Spinal Stenosis  Spinal stenosis occurs when the open space (spinal canal) between the bones of your spine (vertebrae) narrows, putting pressure on the spinal cord or nerves. What are the causes? This condition is caused by areas of bone pushing into the central canals of your vertebrae. This condition may be present at birth (congenital), or it may be caused by:  Arthritic deterioration of your vertebrae (spinal degeneration). This usually starts around age 36.  Injury or trauma to the spine.  Tumors in the spine.  Calcium deposits in the spine. What are the signs or symptoms? Symptoms of this condition include:  Pain in the neck or back that is generally worse with activities, particularly when standing and walking.  Numbness, tingling, hot or cold sensations, weakness, or weariness in your legs.  Pain going up and down the leg (sciatica).  Frequent episodes of falling.  A foot-slapping gait that leads to muscle weakness. In more serious cases, you may develop:  Problemspassing stool or passing urine.  Difficulty having sex.  Loss of feeling in part or all of your leg. Symptoms may come on slowly and get worse over time. How is this diagnosed? This condition is diagnosed based on your medical history and a physical exam. Tests will also be done, such as:  MRI.  CT scan.  X-ray. How is this treated? Treatment for this condition often focuses on managing your pain and any other symptoms. Treatment may include:  Practicing good posture to lessen pressure on your nerves.  Exercising to strengthen muscles, build endurance, improve balance, and maintain good joint movement (range of motion).  Losing weight, if needed.  Taking medicines to reduce swelling, inflammation, or pain.  Assistive devices, such as a corset or brace. In some cases, surgery may be needed. The  most common procedure is decompression laminectomy. This is done to remove excess bone that puts pressure on your nerve roots. Follow these instructions at home: Managing pain, stiffness, and swelling  Do all exercises and stretches as told by your health care provider.  Practice good posture. If you were given a brace or a corset, wear it as told by your health care provider.  Do not do any activities that cause pain. Ask your health care provider what activities are safe for you.  Do not lift anything that is heavier than 10 lb (4.5 kg) or the limit that your health care provider tells you.  Maintain a healthy weight. Talk with your health care provider if you need help losing weight.  If directed, apply heat to the affected area as often as told by your health care provider. Use the heat source that your health care provider recommends, such as a moist heat pack or a heating pad. ? Place a towel between your skin and the heat source. ? Leave the heat on for 20-30 minutes. ? Remove the heat if your skin turns bright red. This is especially important if you are not able to feel pain, heat, or cold. You may have a greater risk of getting burned. General instructions  Take over-the-counter and prescription medicines only as told by your health care provider.  Do not use any products that contain nicotine or tobacco, such as cigarettes and e-cigarettes. If you need help quitting, ask your health care provider.  Eat a healthy diet. This includes plenty of fruits and vegetables, whole grains, and low-fat (  lean) protein.  Keep all follow-up visits as told by your health care provider. This is important. Contact a health care provider if:  Your symptoms do not get better or they get worse.  You have a fever. Get help right away if:  You have new or worse pain in your neck or upper back.  You have severe pain that cannot be controlled with medicines.  You are dizzy.  You have vision  problems, blurred vision, or double vision.  You have a severe headache that is worse when you stand.  You have nausea or you vomit.  You develop new or worse numbness or tingling in your back or legs.  You have pain, redness, swelling, or warmth in your arm or leg. Summary  Spinal stenosis occurs when the open space (spinal canal) between the bones of your spine (vertebrae) narrows. This narrowing puts pressure on the spinal cord or nerves.  Spinal stenosis can cause numbness, weakness, or pain in the neck, back, and legs.  This condition may be caused by a birth defect, arthritic deterioration of your vertebrae, injury, tumors, or calcium deposits.  This condition is usually diagnosed with MRIs, CT scans, and X-rays. This information is not intended to replace advice given to you by your health care provider. Make sure you discuss any questions you have with your health care provider. Document Released: 03/17/2003 Document Revised: 12/07/2016 Document Reviewed: 11/30/2015 Elsevier Patient Education  2020 [employer].

## 2018-11-13 LAB — HEPATIC FUNCTION PANEL
ALT: 21 IU/L (ref 0–32)
AST: 23 IU/L (ref 0–40)
Albumin: 4.4 g/dL (ref 3.8–4.9)
Alkaline Phosphatase: 80 IU/L (ref 39–117)
Bilirubin Total: <0.2 mg/dL (ref 0.0–1.2)
Bilirubin, Direct: 0.07 mg/dL (ref 0.00–0.40)
Total Protein: 7 g/dL (ref 6.0–8.5)

## 2018-11-13 LAB — CBC
Hematocrit: 41.2 % (ref 34.0–46.6)
Hemoglobin: 13.7 g/dL (ref 11.1–15.9)
MCH: 31.6 pg (ref 26.6–33.0)
MCHC: 33.3 g/dL (ref 31.5–35.7)
MCV: 95 fL (ref 79–97)
Platelets: 351 10*3/uL (ref 150–450)
RBC: 4.33 x10E6/uL (ref 3.77–5.28)
RDW: 12.9 % (ref 11.7–15.4)
WBC: 7.6 10*3/uL (ref 3.4–10.8)

## 2018-11-13 LAB — PROGESTERONE: Progesterone: 0.4 ng/mL

## 2018-11-13 LAB — ESTRADIOL: Estradiol: <5 pg/mL

## 2018-11-13 LAB — TESTOSTERONE: Testosterone: 10 ng/dL (ref 3–41)

## 2018-11-17 ENCOUNTER — Other Ambulatory Visit: Payer: Self-pay

## 2018-11-17 ENCOUNTER — Ambulatory Visit
Admission: RE | Admit: 2018-11-17 | Discharge: 2018-11-17 | Disposition: A | Discharge status: Home / Self Care | Payer: 59 | Source: Ambulatory Visit | Attending: Neurosurgery | Admitting: Neurosurgery

## 2018-11-17 ENCOUNTER — Other Ambulatory Visit: Payer: Self-pay | Admitting: Neurosurgery

## 2018-11-17 DIAGNOSIS — M5440 Lumbago with sciatica, unspecified side: Secondary | ICD-10-CM

## 2018-11-17 DIAGNOSIS — M5442 Lumbago with sciatica, left side: Secondary | ICD-10-CM

## 2018-11-17 MED ORDER — METHYLPREDNISOLONE ACETATE 40 MG/ML INJ SUSP (RADIOLOG: 120.0000 mg | Freq: Once | INTRAMUSCULAR | Status: DC

## 2018-11-17 MED ORDER — IOPAMIDOL (ISOVUE-M 200) INJECTION 41%: 1.0000 mL | Freq: Once | INTRAMUSCULAR | Status: DC

## 2018-11-23 NOTE — Progress Notes (Signed)
Subjective:     Patient ID: Michelle Mccarthy , female    DOB: 1960/02/24 , 58 y.o.   MRN: [id number]   Chief Complaint  Patient presents with  . Hormones f/u    HPI  She is here today for f/u BHRT. She feels well on her current regimen. She is not having very many hot flashes. She is still having difficulty sleeping at night.     Past Medical History:  Diagnosis Date  . Family history of ovarian cancer    9/20 genetic testing letter sent  . Hx of basal cell carcinoma 09/18/2016   multiple sites   . Hx of dysplastic nevus 06/11/2017   multiple sites  . Hx of migraines   . Insomnia   . 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 Postmenopausal atrophic vaginitis      Family History  Problem Relation Age of Onset  . Dementia Mother   . Colon cancer Father   . Ovarian cancer Maternal Aunt 60  . Breast cancer Neg Hx      Current Outpatient Medications:  .  albuterol (PROVENTIL HFA;VENTOLIN HFA) 108 (90 Base) MCG/ACT inhaler, , Disp: , Rfl:  .  azelastine (ASTELIN) 0.1 % nasal spray, USE 1 SPRAY IN EACH NOSTRIL TWICE A DAY NASALLY 30 DAY, Disp: , Rfl: 5 .  Cholecalciferol (VITAMIN D3) 5000 units TABS, Take 1 capsule by mouth once., Disp: , Rfl:  .  desloratadine (CLARINEX) 5 MG tablet, Take 5 mg by mouth daily., Disp: , Rfl:  .  Galcanezumab-gnlm (EMGALITY) 120 MG/ML SOAJ, Inject into the skin. One injection monthly, Disp: , Rfl:  .  Magnesium 250 MG TABS, Take by mouth. 1 tablet daily, Disp: , Rfl:  .  Multiple Vitamin (MULTIVITAMIN) tablet, Take 1 tablet by mouth daily., Disp: , Rfl:  .  POTASSIUM CHLORIDE PO, Take 1 tablet by mouth once. 550 mg daily, Disp: , Rfl:  .  vitamin E 1000 UNIT capsule, Take 400 Units by mouth daily. , Disp: , Rfl:    No Known Allergies   Review of Systems  Constitutional: Negative.   Respiratory: Negative.   Cardiovascular: Negative.   Gastrointestinal: Negative.   Musculoskeletal: Positive for back pain.       She has h/o chronic back  pain. It has affected her daily life.   Neurological: Negative.   Psychiatric/Behavioral: Negative.      Today's Vitals   11/12/18 1425  BP: 114/80  Pulse: (!) 50  Temp: 98.1 F (36.7 C)  TempSrc: Oral  Weight: 171 lb (77.6 kg)  Height: 5\' 9"  (1.753 m)   Body mass index is 25.25 kg/m.   Objective:  Physical Exam Vitals signs and nursing note reviewed.  Constitutional:      Appearance: Normal appearance.  HENT:     Head: Normocephalic and atraumatic.  Cardiovascular:     Rate and Rhythm: Normal rate and regular rhythm.     Heart sounds: Normal heart sounds.  Pulmonary:     Effort: Pulmonary effort is normal.     Breath sounds: Normal breath sounds.  Skin:    General: Skin is warm.  Neurological:     General: No focal deficit present.     Mental Status: She is alert.  Psychiatric:        Mood and Affect: Mood normal.        Behavior: Behavior normal.         Assessment And Plan:     1.  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 She will continue with current BHRT regimen. I will check f/u labs as listed below. She will rto in 4 months for re-evaluation.  - Estradiol - Progesterone - Testosterone, Total  2. Spinal stenosis of lumbar region with neurogenic claudication  Chronic. She is encouraged to consider dry needling and chiropractic care. She will consider this in the future. She may also benefit from gabapentin nightly. She prefers to avoid rx meds when possible. She is advised to continue with magnesium nightly.   3. Drug therapy  - Liver Profile - CBC no Diff  4. BMI 25.0-25.9,adult  Her weight is stable.      [name], MD    THE PATIENT IS ENCOURAGED TO PRACTICE SOCIAL DISTANCING DUE TO THE COVID-19 PANDEMIC.

## 2019-02-02 ENCOUNTER — Encounter: Payer: Self-pay | Admitting: Internal Medicine

## 2019-03-19 ENCOUNTER — Encounter: Payer: Self-pay | Admitting: Internal Medicine

## 2019-03-19 ENCOUNTER — Ambulatory Visit: Payer: 59 | Admitting: Internal Medicine

## 2019-03-19 ENCOUNTER — Other Ambulatory Visit: Payer: Self-pay

## 2019-03-19 VITALS — BP 112/78 | HR 90 | Temp 98.1°F | Ht 69.0 in | Wt 174.0 lb

## 2019-03-19 DIAGNOSIS — M48062 Spinal stenosis, lumbar region with neurogenic claudication: Secondary | ICD-10-CM

## 2019-03-19 DIAGNOSIS — R0981 Nasal congestion: Secondary | ICD-10-CM

## 2019-03-19 DIAGNOSIS — Z636 Dependent relative needing care at home: Secondary | ICD-10-CM

## 2019-03-19 DIAGNOSIS — Z79899 Other long term (current) drug therapy: Secondary | ICD-10-CM

## 2019-03-19 DIAGNOSIS — F5102 Adjustment insomnia: Secondary | ICD-10-CM | POA: Diagnosis not present

## 2019-03-19 DIAGNOSIS — N951 Menopausal and female climacteric states: Secondary | ICD-10-CM

## 2019-03-19 MED ORDER — PREDNISONE 10 MG (21) PO TBPK: ORAL_TABLET | ORAL | 0 refills | Status: DC

## 2019-03-19 NOTE — Patient Instructions (Signed)
Managing Stress, Adult Feeling a certain amount of stress is normal. Stress helps our body and mind get ready to deal with the demands of life. Stress hormones can motivate you to do well at work and meet your responsibilities. However severe or long-lasting (chronic) stress can affect your mental and physical health. Chronic stress puts you at higher risk for anxiety, depression, and other health problems like digestive problems, muscle aches, heart disease, high blood pressure, and stroke. What are the causes? Common causes of stress include:  Demands from work, such as deadlines, feeling overworked, or having long hours.  Pressures at home, such as money issues, disagreements with a spouse, or parenting issues.  Pressures from major life changes, such as divorce, moving, loss of a loved one, or chronic illness. You may be at higher risk for stress-related problems if you do not get enough sleep, are in poor health, do not have emotional support, or have a mental health disorder like anxiety or depression. How to recognize stress Stress can make you:  Have trouble sleeping.  Feel sad, anxious, irritable, or overwhelmed.  Lose your appetite.  Overeat or want to eat unhealthy foods.  Want to use drugs or alcohol. Stress can also cause physical symptoms, such as:  Sore, tense muscles, especially in the shoulders and neck.  Headaches.  Trouble breathing.  A faster heart rate.  Stomach pain, nausea, or vomiting.  Diarrhea or constipation.  Trouble concentrating. Follow these instructions at home: Lifestyle  Identify the source of your stress and your reaction to it. See a therapist who can help you change your reactions.  When there are stressful events: ? Talk about it with family, friends, or co-workers. ? Try to think realistically about stressful events and not ignore them or overreact. ? Try to find the positives in a stressful situation and not focus on the  negatives. ? Cut back on responsibilities at work and home, if possible. Ask for help from friends or family members if you need it.  Find ways to cope with stress, such as: ? Meditation. ? Deep breathing. ? Yoga or tai chi. ? Progressive muscle relaxation. ? Doing art, playing music, or reading. ? Making time for fun activities. ? Spending time with family and friends.  Get support from family, friends, or spiritual resources. Eating and drinking  Eat a healthy diet. This includes: ? Eating foods that are high in fiber, such as beans, whole grains, and fresh fruits and vegetables. ? Limiting foods that are high in fat and processed sugars, such as fried and sweet foods.  Do not skip meals or overeat.  Drink enough fluid to keep your urine pale yellow. Alcohol use  Do not drink alcohol if: ? Your health care provider tells you not to drink. ? You are pregnant, may be pregnant, or are planning to become pregnant.  Drinking alcohol is a way some people try to ease their stress. This can be dangerous, so if you drink alcohol: ? Limit how much you use to:  0-1 drink a day for women.  0-2 drinks a day for men. ? Be aware of how much alcohol is in your drink. In the [place] one drink equals one 12 oz bottle of beer (355 mL), one 5 oz glass of wine (148 mL), or one 1 oz glass of hard liquor (44 mL). Activity   Include 30 minutes of exercise in your daily schedule. Exercise is a good stress reducer.  Include time in your day   for an activity that you find relaxing. Try taking a walk, going on a bike ride, reading a book, or listening to music.  Schedule your time in a way that lowers stress, and keep a consistent schedule. Prioritize what is most important to get done. General instructions  Get enough sleep. Try to go to sleep and get up at about the same time every day.  Take over-the-counter and prescription medicines only as told by your health care provider.  Do not use any  products that contain nicotine or tobacco, such as cigarettes, e-cigarettes, and chewing tobacco. If you need help quitting, ask your health care provider.  Do not use drugs or smoke to cope with stress.  Keep all follow-up visits as told by your health care provider. This is important. Where to find support  Talk with your health care provider about stress management or finding a support group.  Find a therapist to work with you on your stress management techniques. Contact a health care provider if:  Your stress symptoms get worse.  You are unable to manage your stress at home.  You are struggling to stop using drugs or alcohol. Get help right away if:  You may be a danger to yourself or others.  You have any thoughts of death or suicide. If you ever feel like you may hurt yourself or others, or have thoughts about taking your own life, get help right away. You can go to your nearest emergency department or call:  Your local emergency services (911 in the [place]  A suicide crisis helpline, such as the [place] at 786-499-0900. This is open 24 hours a day. Summary  Feeling a certain amount of stress is normal, but severe or long-lasting (chronic) stress can affect your mental and physical health.  Chronic stress can put you at higher risk for anxiety, depression, and other health problems like digestive problems, muscle aches, heart disease, high blood pressure, and stroke.  You may be at higher risk for stress-related problems if you do not get enough sleep, are in poor health, lack emotional support, or have a mental health disorder like anxiety or depression.  Identify the source of your stress and your reaction to it. Try talking about stressful events with family, friends, or co-workers, finding a coping method, or getting support from spiritual resources.  If you need more help, talk with your health care provider about finding a support group  or a mental health therapist. This information is not intended to replace advice given to you by your health care provider. Make sure you discuss any questions you have with your health care provider. Document Revised: 07/23/2018 Document Reviewed: 07/23/2018 Elsevier Patient Education  [place].

## 2019-03-20 LAB — HEPATIC FUNCTION PANEL
ALT: 18 IU/L (ref 0–32)
AST: 21 IU/L (ref 0–40)
Albumin: 4.5 g/dL (ref 3.8–4.9)
Alkaline Phosphatase: 77 IU/L (ref 39–117)
Bilirubin Total: 0.2 mg/dL (ref 0.0–1.2)
Bilirubin, Direct: 0.07 mg/dL (ref 0.00–0.40)
Total Protein: 6.7 g/dL (ref 6.0–8.5)

## 2019-03-20 LAB — TESTOSTERONE: Testosterone: 9 ng/dL (ref 3–41)

## 2019-03-20 LAB — CBC
Hematocrit: 40.6 % (ref 34.0–46.6)
Hemoglobin: 13.6 g/dL (ref 11.1–15.9)
MCH: 31.6 pg (ref 26.6–33.0)
MCHC: 33.5 g/dL (ref 31.5–35.7)
MCV: 94 fL (ref 79–97)
Platelets: 326 10*3/uL (ref 150–450)
RBC: 4.3 x10E6/uL (ref 3.77–5.28)
RDW: 12.3 % (ref 11.7–15.4)
WBC: 5.9 10*3/uL (ref 3.4–10.8)

## 2019-04-06 NOTE — Progress Notes (Signed)
This visit occurred during the SARS-CoV-2 public health emergency.  Safety protocols were in place, including screening questions prior to the visit, additional usage of staff PPE, and extensive cleaning of exam room while observing appropriate contact time as indicated for disinfecting solutions.  Subjective:     Patient ID: Michelle Mccarthy , female    DOB: 1960/12/14 , 59 y.o.   MRN: [id number]   Chief Complaint  Patient presents with  . Hormones f/u    HPI  She is here today for f/u BHRT. She feels good on her current regimen. She reports compliance. Has noticed decreased hot flashes.     Past Medical History:  Diagnosis Date  . Family history of ovarian cancer    9/20 genetic testing letter sent  . Hx of basal cell carcinoma 09/18/2016   multiple sites   . Hx of dysplastic nevus 06/11/2017   multiple sites  . Hx of migraines   . Insomnia   . 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 Postmenopausal atrophic vaginitis      Family History  Problem Relation Age of Onset  . Dementia Mother   . Colon cancer Father   . Ovarian cancer Maternal Aunt 60  . Breast cancer Neg Hx      Current Outpatient Medications:  .  albuterol (PROVENTIL HFA;VENTOLIN HFA) 108 (90 Base) MCG/ACT inhaler, , Disp: , Rfl:  .  azelastine (ASTELIN) 0.1 % nasal spray, USE 1 SPRAY IN EACH NOSTRIL TWICE A DAY NASALLY 30 DAY, Disp: , Rfl: 5 .  Cholecalciferol (VITAMIN D3) 5000 units TABS, Take 1 capsule by mouth once., Disp: , Rfl:  .  desloratadine (CLARINEX) 5 MG tablet, Take 5 mg by mouth daily., Disp: , Rfl:  .  Galcanezumab-gnlm (EMGALITY) 120 MG/ML SOAJ, Inject into the skin. One injection monthly, Disp: , Rfl:  .  Magnesium 250 MG TABS, Take by mouth. 1 tablet daily, Disp: , Rfl:  .  Multiple Vitamin (MULTIVITAMIN) tablet, Take 1 tablet by mouth daily., Disp: , Rfl:  .  POTASSIUM CHLORIDE PO, Take 1 tablet by mouth once. 550 mg daily, Disp: , Rfl:  .  vitamin E 1000 UNIT capsule, Take 400  Units by mouth daily. , Disp: , Rfl:  .  predniSONE (STERAPRED UNI-PAK 21 TAB) 10 MG (21) TBPK tablet, Dispense as 6 day dose pack, use as directed, Disp: 21 tablet, Rfl: 0   No Known Allergies   Review of Systems  Constitutional: Negative.   Respiratory: Negative.   Cardiovascular: Negative.   Gastrointestinal: Negative.   Musculoskeletal: Positive for back pain.  Neurological: Negative.   Psychiatric/Behavioral: Positive for sleep disturbance.     Today's Vitals   03/19/19 0946  BP: 112/78  Pulse: 90  Temp: 98.1 F (36.7 C)  TempSrc: Oral  Weight: 174 lb (78.9 kg)  Height: 5\' 9"  (1.753 m)   Body mass index is 25.7 kg/m.   Objective:  Physical Exam Vitals and nursing note reviewed.  Constitutional:      Appearance: Normal appearance.  HENT:     Head: Normocephalic and atraumatic.  Cardiovascular:     Rate and Rhythm: Normal rate and regular rhythm.     Heart sounds: Normal heart sounds.  Pulmonary:     Effort: Pulmonary effort is normal.     Breath sounds: Normal breath sounds.  Skin:    General: Skin is warm.  Neurological:     General: No focal deficit present.     Mental Status:  She is alert.  Psychiatric:        Mood and Affect: Mood normal.        Behavior: Behavior normal.         Assessment And Plan:     1. 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 She will continue with progesterone 75/10DHEA nightly. She will also continue with estriol/test 0.5/0.5 three times weekly vaginally. She will rto in 4 months for re-evaluation.   2. Caregiver stress  Chronic. Pt advised that she should join caregiver support group.   3. Spinal stenosis of lumbar region with neurogenic claudication Chronic. Encouraged to continue with magnesium supplementation and to perform stretching exercises regularly.  4. Adjustment insomnia  Chronic. Related to her stress level. Pt reminded that prednisone may interfere with her sleep quality.   5. Sinus congestion  She was  given rx prednisone 10mg , six day dose pack. Advised to take full course of meds. Advised to also remove dairy from her diet.   6. Drug therapy  - Liver Profile - CBC no Diff - Testosterone, Total    [name], MD    THE PATIENT IS ENCOURAGED TO PRACTICE SOCIAL DISTANCING DUE TO THE COVID-19 PANDEMIC.

## 2019-06-18 ENCOUNTER — Other Ambulatory Visit: Payer: Self-pay

## 2019-06-18 ENCOUNTER — Ambulatory Visit: Payer: 59 | Admitting: Dermatology

## 2019-06-18 DIAGNOSIS — Z1283 Encounter for screening for malignant neoplasm of skin: Secondary | ICD-10-CM | POA: Diagnosis not present

## 2019-06-18 DIAGNOSIS — L304 Erythema intertrigo: Secondary | ICD-10-CM

## 2019-06-18 DIAGNOSIS — L578 Other skin changes due to chronic exposure to nonionizing radiation: Secondary | ICD-10-CM

## 2019-06-18 DIAGNOSIS — D229 Melanocytic nevi, unspecified: Secondary | ICD-10-CM

## 2019-06-18 DIAGNOSIS — Z85828 Personal history of other malignant neoplasm of skin: Secondary | ICD-10-CM

## 2019-06-18 DIAGNOSIS — L821 Other seborrheic keratosis: Secondary | ICD-10-CM

## 2019-06-18 DIAGNOSIS — L820 Inflamed seborrheic keratosis: Secondary | ICD-10-CM | POA: Diagnosis not present

## 2019-06-18 DIAGNOSIS — D1800 Hemangioma unspecified site: Secondary | ICD-10-CM

## 2019-06-18 DIAGNOSIS — L719 Rosacea, unspecified: Secondary | ICD-10-CM | POA: Diagnosis not present

## 2019-06-18 DIAGNOSIS — M7011 Bursitis, right hand: Secondary | ICD-10-CM

## 2019-06-18 DIAGNOSIS — Z86018 Personal history of other benign neoplasm: Secondary | ICD-10-CM

## 2019-06-18 DIAGNOSIS — L814 Other melanin hyperpigmentation: Secondary | ICD-10-CM

## 2019-06-18 NOTE — Patient Instructions (Addendum)
Recommend daily broad spectrum sunscreen SPF 30+ to sun-exposed areas, reapply every 2 hours as needed. Call for new or changing lesions.  Instructions for [facility] Medications  One or more of your medications was sent to the [facility] mail order compounding pharmacy. You will receive an email from them and can purchase the medicine through that link. It will then be mailed to your home at the address you confirmed. If for any reason you do not receive an email from them, please check your spam folder. If you still do not find the email, please let us know.   Cryotherapy Aftercare  . Wash gently with soap and water everyday.   Marland Kitchen Apply Vaseline and Band-Aid daily until healed.

## 2019-06-18 NOTE — Progress Notes (Signed)
Follow-Up Visit   Subjective  Michelle Mccarthy is a 59 y.o. female who presents for the following: Annual Exam.  Patient here today for 6 month TBSE. She has a history of BCC and Dysplastic Nevi. She is not aware of any new or changing spots but does complain that her nose is itching. She has been using [place] and is wondering if it has stopped working. She does get pimples with the rosacea. \ The patient presents for Total-Body Skin Exam (TBSE) for skin cancer screening and mole check.   The following portions of the chart were reviewed this encounter and updated as appropriate:  Tobacco  Allergies  Meds  Problems  Med Hx  Surg Hx  Fam Hx      Review of Systems:  No other skin or systemic complaints except as noted in HPI or Assessment and Plan.  Objective  Well appearing patient in no apparent distress; mood and affect are within normal limits.  A full examination was performed including scalp, head, eyes, ears, nose, lips, neck, chest, axillae, abdomen, back, buttocks, bilateral upper extremities, bilateral lower extremities, hands, feet, fingers, toes, fingernails, and toenails. All findings within normal limits unless otherwise noted below.  Objective  Left arm inf lat deltoid: Dyspigmented smooth macule or patch.   Objective  Left anterior waistline lateral/excision, multiple sites mild-mod: Dyspigmented smooth macule or patch.   Objective  Head - Anterior (Face): Mid face erythema with telangiectasias +/- scattered inflammatory papules.   Objective  Right Popliteal x 2, left thigh x 2, left knee x 1 (5): Erythematous keratotic or waxy stuck-on papule or plaque.   Objective  Inframmary: Erythema with maceration  Objective  Right Hand - Anterior: Swelling of right palm   Assessment & Plan  History of basal cell carcinoma (BCC) Left arm inf lat deltoid  No evidence of recurrence, call clinic for new or changing lesions.   History of dysplastic  nevus Left anterior waistline lateral/excision, multiple sites mild-mod  No evidence of recurrence, call clinic for new or changing lesions.   Rosacea Head - Anterior (Face)  Start [facility] triple cream BID  Inflamed seborrheic keratosis (5) Right Popliteal x 2, left thigh x 2, left knee x 1  Destruction of lesion - Right Popliteal x 2, left thigh x 2, left knee x 1 Complexity: simple   Destruction method: cryotherapy   Informed consent: discussed and consent obtained   Lesion destroyed using liquid nitrogen: Yes   Region frozen until ice ball extended beyond lesion: Yes   Outcome: patient tolerated procedure well with no complications   Post-procedure details: wound care instructions given    Erythema intertrigo Inframmary  Start [facility] Iodoquinole-HTC QD-BID  Bursitis of right hand Right Hand - Anterior  Vs dupuytren's contracture  Recommend evaluation with orthopaedic surgery - hand surgeon.   Lentigines - Scattered tan macules - Discussed due to sun exposure - Benign, observe - Call for any changes  Seborrheic Keratoses - Stuck-on, waxy, tan-brown papules and plaques  - Discussed benign etiology and prognosis. - Observe - Call for any changes  Melanocytic Nevi - Tan-brown and/or pink-flesh-colored symmetric macules and papules - Benign appearing on exam today - Observation - Call clinic for new or changing moles - Recommend daily use of broad spectrum spf 30+ sunscreen to sun-exposed areas.   Hemangiomas - Red papules - Discussed benign nature - Observe - Call for any changes  Actinic Damage - diffuse scaly erythematous macules with underlying dyspigmentation - Recommend  daily broad spectrum sunscreen SPF 30+ to sun-exposed areas, reapply every 2 hours as needed.  - Call for new or changing lesions.  Skin cancer screening performed today.   Return in about 6 months (around 12/18/2019) for TBSE.  Graciella Belton, RMA, am acting  as scribe for Sarina Ser, MD .  Documentation: I have reviewed the above documentation for accuracy and completeness, and I agree with the above.  Sarina Ser, MD

## 2019-06-22 ENCOUNTER — Encounter: Payer: Self-pay | Admitting: Dermatology

## 2019-07-23 ENCOUNTER — Ambulatory Visit: Payer: 59 | Admitting: Internal Medicine

## 2019-07-23 ENCOUNTER — Other Ambulatory Visit: Payer: Self-pay

## 2019-07-23 ENCOUNTER — Encounter: Payer: Self-pay | Admitting: Internal Medicine

## 2019-07-23 VITALS — BP 114/78 | HR 86 | Temp 98.1°F | Ht 69.0 in | Wt 173.8 lb

## 2019-07-23 DIAGNOSIS — Z800 Family history of malignant neoplasm of digestive organs: Secondary | ICD-10-CM

## 2019-07-23 DIAGNOSIS — N951 Menopausal and female climacteric states: Secondary | ICD-10-CM | POA: Diagnosis not present

## 2019-07-23 DIAGNOSIS — E559 Vitamin D deficiency, unspecified: Secondary | ICD-10-CM

## 2019-07-23 DIAGNOSIS — Z6825 Body mass index (BMI) 25.0-25.9, adult: Secondary | ICD-10-CM | POA: Diagnosis not present

## 2019-07-23 DIAGNOSIS — M48062 Spinal stenosis, lumbar region with neurogenic claudication: Secondary | ICD-10-CM

## 2019-07-23 DIAGNOSIS — Z1211 Encounter for screening for malignant neoplasm of colon: Secondary | ICD-10-CM

## 2019-07-23 DIAGNOSIS — Z79899 Other long term (current) drug therapy: Secondary | ICD-10-CM

## 2019-07-23 NOTE — Patient Instructions (Signed)
Exercising to Stay Healthy To become healthy and stay healthy, it is recommended that you do moderate-intensity and vigorous-intensity exercise. You can tell that you are exercising at a moderate intensity if your heart starts beating faster and you start breathing faster but can still hold a conversation. You can tell that you are exercising at a vigorous intensity if you are breathing much harder and faster and cannot hold a conversation while exercising. Exercising regularly is important. It has many health benefits, such as:  Improving overall fitness, flexibility, and endurance.  Increasing bone density.  Helping with weight control.  Decreasing body fat.  Increasing muscle strength.  Reducing stress and tension.  Improving overall health. How often should I exercise? Choose an activity that you enjoy, and set realistic goals. Your health care provider can help you make an activity plan that works for you. Exercise regularly as told by your health care provider. This may include:  Doing strength training two times a week, such as: ? Lifting weights. ? Using resistance bands. ? Push-ups. ? Sit-ups. ? Yoga.  Doing a certain intensity of exercise for a given amount of time. Choose from these options: ? A total of 150 minutes of moderate-intensity exercise every week. ? A total of 75 minutes of vigorous-intensity exercise every week. ? A mix of moderate-intensity and vigorous-intensity exercise every week. Children, pregnant women, people who have not exercised regularly, people who are overweight, and older adults may need to talk with a health care provider about what activities are safe to do. If you have a medical condition, be sure to talk with your health care provider before you start a new exercise program. What are some exercise ideas? Moderate-intensity exercise ideas include:  Walking 1 mile (1.6 km) in about 15  minutes.  Biking.  Hiking.  Golfing.  Dancing.  Water aerobics. Vigorous-intensity exercise ideas include:  Walking 4.5 miles (7.2 km) or more in about 1 hour.  Jogging or running 5 miles (8 km) in about 1 hour.  Biking 10 miles (16.1 km) or more in about 1 hour.  Lap swimming.  Roller-skating or in-line skating.  Cross-country skiing.  Vigorous competitive sports, such as football, basketball, and soccer.  Jumping rope.  Aerobic dancing. What are some everyday activities that can help me to get exercise?  Yard work, such as: ? Pushing a lawn mower. ? Raking and bagging leaves.  Washing your car.  Pushing a stroller.  Shoveling snow.  Gardening.  Washing windows or floors. How can I be more active in my day-to-day activities?  Use stairs instead of an elevator.  Take a walk during your lunch break.  If you drive, park your car farther away from your work or school.  If you take public transportation, get off one stop early and walk the rest of the way.  Stand up or walk around during all of your indoor phone calls.  Get up, stretch, and walk around every 30 minutes throughout the day.  Enjoy exercise with a friend. Support to continue exercising will help you keep a regular routine of activity. What guidelines can I follow while exercising?  Before you start a new exercise program, talk with your health care provider.  Do not exercise so much that you hurt yourself, feel dizzy, or get very short of breath.  Wear comfortable clothes and wear shoes with good support.  Drink plenty of water while you exercise to prevent dehydration or heat stroke.  Work out until your breathing   and your heartbeat get faster. Where to find more information  [place] Department of Health and Human Services: www.hhs.gov  Centers for Disease Control and Prevention (CDC): www.cdc.gov Summary  Exercising regularly is important. It will improve your overall fitness,  flexibility, and endurance.  Regular exercise also will improve your overall health. It can help you control your weight, reduce stress, and improve your bone density.  Do not exercise so much that you hurt yourself, feel dizzy, or get very short of breath.  Before you start a new exercise program, talk with your health care provider. This information is not intended to replace advice given to you by your health care provider. Make sure you discuss any questions you have with your health care provider. Document Revised: 12/07/2016 Document Reviewed: 11/15/2016 Elsevier Patient Education  2020 [employer]  

## 2019-07-23 NOTE — Progress Notes (Signed)
I,Katawbba Wiggins,acting as a Education administrator for [name], MD.,have documented all relevant documentation on the behalf of [name], MD,as directed by  [name], MD while in the presence of [name], MD.  This visit occurred during the SARS-CoV-2 public health emergency.  Safety protocols were in place, including screening questions prior to the visit, additional usage of staff PPE, and extensive cleaning of exam room while observing appropriate contact time as indicated for disinfecting solutions.  Subjective:     Patient ID: Michelle Mccarthy , female    DOB: 26-Nov-1960 , 59 y.o.   MRN: [id number]   Chief Complaint  Patient presents with  . Hormones f/u    HPI  She is here today for f/u BHRT.  She feels well on her current regimen. She reports having her physicals performed by Dr. Kenton Kingfisher.     Past Medical History:  Diagnosis Date  . Atypical mole 11/27/2017   L mid side/mod  . Atypical mole 12/18/2018   L mid to upper back/mod, L lateral buttock/mod  . Family history of ovarian cancer    9/20 genetic testing letter sent  . Hx of basal cell carcinoma 09/18/2016   multiple sites   . Hx of dysplastic nevus 06/11/2017   L ant waistline lateral/excision  . Hx of migraines   . Insomnia   . 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 Postmenopausal atrophic vaginitis      Family History  Problem Relation Age of Onset  . Dementia Mother   . Colon cancer Father   . Ovarian cancer Maternal Aunt 60  . Breast cancer Neg Hx      Current Outpatient Medications:  .  albuterol (PROVENTIL HFA;VENTOLIN HFA) 108 (90 Base) MCG/ACT inhaler, , Disp: , Rfl:  .  azelastine (ASTELIN) 0.1 % nasal spray, USE 1 SPRAY IN EACH NOSTRIL TWICE A DAY NASALLY 30 DAY, Disp: , Rfl: 5 .  Cholecalciferol (VITAMIN D3) 5000 units TABS, Take 1 capsule by mouth once., Disp: , Rfl:  .  desloratadine (CLARINEX) 5 MG tablet, Take 5 mg by mouth daily., Disp: , Rfl:  .  Galcanezumab-gnlm  (EMGALITY) 120 MG/ML SOAJ, Inject into the skin. One injection monthly, Disp: , Rfl:  .  Magnesium 250 MG TABS, Take by mouth. 1 tablet daily, Disp: , Rfl:  .  Multiple Vitamin (MULTIVITAMIN) tablet, Take 1 tablet by mouth daily., Disp: , Rfl:  .  POTASSIUM CHLORIDE PO, Take 1 tablet by mouth once. 550 mg daily, Disp: , Rfl:  .  vitamin E 1000 UNIT capsule, Take 400 Units by mouth daily. , Disp: , Rfl:    No Known Allergies   Review of Systems  Constitutional: Negative.   Respiratory: Negative.   Cardiovascular: Negative.   Gastrointestinal: Negative.   Musculoskeletal: Positive for back pain.       She recently had PRP therapy for chronic back pain. She has noticed some improvement.   Psychiatric/Behavioral: Negative.   All other systems reviewed and are negative.    Today's Vitals   07/23/19 0835  BP: 114/78  Pulse: 86  Temp: 98.1 F (36.7 C)  TempSrc: Oral  Weight: 173 lb 12.8 oz (78.8 kg)  Height: _0  (1.753 m)   Body mass index is 25.67 kg/m.  Wt Readings from Last 3 Encounters:  07/23/19 173 lb 12.8 oz (78.8 kg)  03/19/19 174 lb (78.9 kg)  11/12/18 171 lb (77.6 kg)   Objective:  Physical Exam Vitals  and nursing note reviewed.  Constitutional:      Appearance: Normal appearance. She is obese.  HENT:     Head: Normocephalic and atraumatic.  Cardiovascular:     Rate and Rhythm: Normal rate and regular rhythm.     Heart sounds: Normal heart sounds.  Pulmonary:     Breath sounds: Normal breath sounds.  Skin:    General: Skin is warm.  Neurological:     General: No focal deficit present.     Mental Status: She is alert and oriented to person, place, and time.         Assessment And Plan:    1. 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 Now off of estriol/testosterone cream. Only on progesterone capsules.   2. Drug therapy  - Vitamin B12 - CMP14+EGFR  3. BMI 25.0-25.9,adult  Her weight is stable. Encouraged to aim for at least 150 minutes of exercise per  week.   4. Screen for colon cancer  She agrees to GI referral for CRC screening.   - Ambulatory referral to Gastroenterology  5. Family history of colon cancer in father  - Ambulatory referral to Gastroenterology  6. Mild vitamin D deficiency  I WILL CHECK A VIT D LEVEL AND SUPPLEMENT AS NEEDED.  ALSO ENCOURAGED TO SPEND 15 MINUTES IN THE SUN DAILY.  - Vitamin D (25 hydroxy)  7. Spinal stenosis of lumbar region with neurogenic claudication  Chronic, s/p PRP therapy. She has noticed improvement. Max improvement should be noted around 6-8 weeks.      Patient was given opportunity to ask questions. Patient verbalized understanding of the plan and was able to repeat key elements of the plan. All questions were answered to their satisfaction.  [name], MD   I, [name], MD, have reviewed all documentation for this visit. The documentation on 07/24/19 for the exam, diagnosis, procedures, and orders are all accurate and complete.  THE PATIENT IS ENCOURAGED TO PRACTICE SOCIAL DISTANCING DUE TO THE COVID-19 PANDEMIC.

## 2019-07-24 LAB — CMP14+EGFR
ALT: 17 IU/L (ref 0–32)
AST: 20 IU/L (ref 0–40)
Albumin/Globulin Ratio: 1.8 (ref 1.2–2.2)
Albumin: 4.5 g/dL (ref 3.8–4.9)
Alkaline Phosphatase: 96 IU/L (ref 48–121)
BUN/Creatinine Ratio: 20 (ref 9–23)
BUN: 15 mg/dL (ref 6–24)
Bilirubin Total: 0.3 mg/dL (ref 0.0–1.2)
CO2: 25 mmol/L (ref 20–29)
Calcium: 9.7 mg/dL (ref 8.7–10.2)
Chloride: 99 mmol/L (ref 96–106)
Creatinine, Ser: 0.75 mg/dL (ref 0.57–1.00)
GFR calc Af Amer: 101 mL/min/{1.73_m2} (ref >59)
GFR calc non Af Amer: 88 mL/min/{1.73_m2} (ref >59)
Globulin, Total: 2.5 g/dL (ref 1.5–4.5)
Glucose: 97 mg/dL (ref 65–99)
Potassium: 4.8 mmol/L (ref 3.5–5.2)
Sodium: 140 mmol/L (ref 134–144)
Total Protein: 7 g/dL (ref 6.0–8.5)

## 2019-07-24 LAB — VITAMIN B12: Vitamin B-12: 590 pg/mL (ref 232–1245)

## 2019-07-24 LAB — VITAMIN D 25 HYDROXY (VIT D DEFICIENCY, FRACTURES): Vit D, 25-Hydroxy: 90.2 ng/mL (ref 30.0–100.0)

## 2019-08-04 ENCOUNTER — Telehealth: Payer: 59

## 2019-09-02 ENCOUNTER — Telehealth (INDEPENDENT_AMBULATORY_CARE_PROVIDER_SITE_OTHER): Payer: Self-pay | Admitting: Gastroenterology

## 2019-09-02 ENCOUNTER — Other Ambulatory Visit: Payer: Self-pay

## 2019-09-02 DIAGNOSIS — Z1211 Encounter for screening for malignant neoplasm of colon: Secondary | ICD-10-CM

## 2019-09-02 DIAGNOSIS — Z800 Family history of malignant neoplasm of digestive organs: Secondary | ICD-10-CM

## 2019-09-02 NOTE — Progress Notes (Signed)
Gastroenterology Pre-Procedure Review  Request Date: Friday 09/25/19 Requesting Physician: Dr. Allen Norris  PATIENT REVIEW QUESTIONS: The patient responded to the following health history questions as indicated:    1. Are you having any GI issues? no 2. Do you have a personal history of Polyps? yes (patient states she had a colonoscopy around 2016 with Dr. Allen Norris, thought polyps were present however i did not see a record of this in her chart.) 3. Do you have a family history of Colon Cancer or Polyps? yes (father colon cancer) 4. Diabetes Mellitus? no 5. Joint replacements in the past 12 months?no 6. Major health problems in the past 3 months?no 7. Any artificial heart valves, MVP, or defibrillator?no    MEDICATIONS & ALLERGIES:    Patient reports the following regarding taking any anticoagulation/antiplatelet therapy:   Plavix, Coumadin, Eliquis, Xarelto, Lovenox, Pradaxa, Brilinta, or Effient? no Aspirin? no  Patient confirms/reports the following medications:  Current Outpatient Medications  Medication Sig Dispense Refill  . albuterol (PROVENTIL HFA;VENTOLIN HFA) 108 (90 Base) MCG/ACT inhaler     . azelastine (ASTELIN) 0.1 % nasal spray USE 1 SPRAY IN EACH NOSTRIL TWICE A DAY NASALLY 30 DAY  5  . BOTOX 100 units SOLR injection     . Cholecalciferol (VITAMIN D3) 5000 units TABS Take 1 capsule by mouth once.    . desloratadine (CLARINEX) 5 MG tablet Take 5 mg by mouth daily.    . diclofenac (VOLTAREN) 50 MG EC tablet     . EPINEPHrine 0.3 mg/0.3 mL IJ SOAJ injection AS DIRECTED INJECTION 30    . Galcanezumab-gnlm (EMGALITY) 120 MG/ML SOAJ Inject into the skin. One injection monthly    . imipramine (TOFRANIL) 25 MG tablet Take 25 mg by mouth daily.    . Magnesium 250 MG TABS Take by mouth. 1 tablet daily    . Multiple Vitamin (MULTIVITAMIN) tablet Take 1 tablet by mouth daily.    Marland Kitchen POTASSIUM CHLORIDE PO Take 1 tablet by mouth once. 550 mg daily    . PULMICORT FLEXHALER 180 MCG/ACT inhaler  Inhale 2 puffs into the lungs 2 (two) times daily.    . vitamin E 1000 UNIT capsule Take 400 Units by mouth daily.      No current facility-administered medications for this visit.    Patient confirms/reports the following allergies:  No Known Allergies  No orders of the defined types were placed in this encounter.   AUTHORIZATION INFORMATION Primary Insurance: 1D#: Group #:  Secondary Insurance: 1D#: Group #:  SCHEDULE INFORMATION: Date: Friday 09/17/21Time: Location:[facility]

## 2019-09-17 ENCOUNTER — Encounter: Payer: Self-pay | Admitting: Gastroenterology

## 2019-09-17 ENCOUNTER — Other Ambulatory Visit: Payer: Self-pay

## 2019-09-18 ENCOUNTER — Other Ambulatory Visit: Payer: Self-pay

## 2019-09-18 MED ORDER — CLENPIQ 10-3.5-12 MG-GM -GM/160ML PO SOLN: 320.0000 mL | ORAL | 0 refills | Status: DC

## 2019-09-23 ENCOUNTER — Other Ambulatory Visit: Payer: Self-pay

## 2019-09-23 ENCOUNTER — Other Ambulatory Visit
Admission: RE | Admit: 2019-09-23 | Discharge: 2019-09-23 | Disposition: A | Discharge status: Home / Self Care | Payer: 59 | Source: Ambulatory Visit | Attending: Gastroenterology | Admitting: Gastroenterology

## 2019-09-23 DIAGNOSIS — Z01812 Encounter for preprocedural laboratory examination: Secondary | ICD-10-CM | POA: Insufficient documentation

## 2019-09-23 DIAGNOSIS — Z20822 Contact with and (suspected) exposure to covid-19: Secondary | ICD-10-CM | POA: Insufficient documentation

## 2019-09-23 LAB — SARS CORONAVIRUS 2 (TAT 6-24 HRS): SARS Coronavirus 2: NEGATIVE

## 2019-09-24 NOTE — Discharge Instructions (Signed)
General Anesthesia, Adult, Care After This sheet gives you information about how to care for yourself after your procedure. Your health care provider may also give you more specific instructions. If you have problems or questions, contact your health care provider. What can I expect after the procedure? After the procedure, the following side effects are common:  Pain or discomfort at the IV site.  Nausea.  Vomiting.  Sore throat.  Trouble concentrating.  Feeling cold or chills.  Weak or tired.  Sleepiness and fatigue.  Soreness and body aches. These side effects can affect parts of the body that were not involved in surgery. Follow these instructions at home:  For at least 24 hours after the procedure:  Have a responsible adult stay with you. It is important to have someone help care for you until you are awake and alert.  Rest as needed.  Do not: ? Participate in activities in which you could fall or become injured. ? Drive. ? Use heavy machinery. ? Drink alcohol. ? Take sleeping pills or medicines that cause drowsiness. ? Make important decisions or sign legal documents. ? Take care of children on your own. Eating and drinking  Follow any instructions from your health care provider about eating or drinking restrictions.  When you feel hungry, start by eating small amounts of foods that are soft and easy to digest (bland), such as toast. Gradually return to your regular diet.  Drink enough fluid to keep your urine pale yellow.  If you vomit, rehydrate by drinking water, juice, or clear broth. General instructions  If you have sleep apnea, surgery and certain medicines can increase your risk for breathing problems. Follow instructions from your health care provider about wearing your sleep device: ? Anytime you are sleeping, including during daytime naps. ? While taking prescription pain medicines, sleeping medicines, or medicines that make you drowsy.  Return to  your normal activities as told by your health care provider. Ask your health care provider what activities are safe for you.  Take over-the-counter and prescription medicines only as told by your health care provider.  If you smoke, do not smoke without supervision.  Keep all follow-up visits as told by your health care provider. This is important. Contact a health care provider if:  You have nausea or vomiting that does not get better with medicine.  You cannot eat or drink without vomiting.  You have pain that does not get better with medicine.  You are unable to pass urine.  You develop a skin rash.  You have a fever.  You have redness around your IV site that gets worse. Get help right away if:  You have difficulty breathing.  You have chest pain.  You have blood in your urine or stool, or you vomit blood. Summary  After the procedure, it is common to have a sore throat or nausea. It is also common to feel tired.  Have a responsible adult stay with you for the first 24 hours after general anesthesia. It is important to have someone help care for you until you are awake and alert.  When you feel hungry, start by eating small amounts of foods that are soft and easy to digest (bland), such as toast. Gradually return to your regular diet.  Drink enough fluid to keep your urine pale yellow.  Return to your normal activities as told by your health care provider. Ask your health care provider what activities are safe for you. This information is not   intended to replace advice given to you by your health care provider. Make sure you discuss any questions you have with your health care provider. Document Revised: 12/28/2016 Document Reviewed: 08/10/2016 Elsevier Patient Education  2020 [employer]vier Inc.  

## 2019-09-25 ENCOUNTER — Other Ambulatory Visit: Payer: Self-pay

## 2019-09-25 ENCOUNTER — Encounter
Admission: RE | Disposition: A | Discharge status: Home / Self Care | Payer: Self-pay | Source: Home / Self Care | Attending: Gastroenterology

## 2019-09-25 ENCOUNTER — Ambulatory Visit: Payer: 59 | Admitting: Anesthesiology

## 2019-09-25 ENCOUNTER — Ambulatory Visit
Admission: RE | Admit: 2019-09-25 | Discharge: 2019-09-25 | Disposition: A | Discharge status: Home / Self Care | Payer: 59 | Attending: Gastroenterology | Admitting: Gastroenterology

## 2019-09-25 ENCOUNTER — Encounter: Payer: Self-pay | Admitting: Gastroenterology

## 2019-09-25 DIAGNOSIS — Z7951 Long term (current) use of inhaled steroids: Secondary | ICD-10-CM | POA: Diagnosis not present

## 2019-09-25 DIAGNOSIS — Z1211 Encounter for screening for malignant neoplasm of colon: Secondary | ICD-10-CM | POA: Diagnosis not present

## 2019-09-25 DIAGNOSIS — D123 Benign neoplasm of transverse colon: Secondary | ICD-10-CM | POA: Diagnosis not present

## 2019-09-25 DIAGNOSIS — K640 First degree hemorrhoids: Secondary | ICD-10-CM | POA: Diagnosis not present

## 2019-09-25 DIAGNOSIS — Z79899 Other long term (current) drug therapy: Secondary | ICD-10-CM | POA: Diagnosis not present

## 2019-09-25 DIAGNOSIS — M1990 Unspecified osteoarthritis, unspecified site: Secondary | ICD-10-CM | POA: Insufficient documentation

## 2019-09-25 DIAGNOSIS — Z791 Long term (current) use of non-steroidal anti-inflammatories (NSAID): Secondary | ICD-10-CM | POA: Insufficient documentation

## 2019-09-25 DIAGNOSIS — Z8041 Family history of malignant neoplasm of ovary: Secondary | ICD-10-CM | POA: Insufficient documentation

## 2019-09-25 DIAGNOSIS — K219 Gastro-esophageal reflux disease without esophagitis: Secondary | ICD-10-CM | POA: Insufficient documentation

## 2019-09-25 DIAGNOSIS — J45909 Unspecified asthma, uncomplicated: Secondary | ICD-10-CM | POA: Diagnosis not present

## 2019-09-25 DIAGNOSIS — Z800 Family history of malignant neoplasm of digestive organs: Secondary | ICD-10-CM | POA: Diagnosis not present

## 2019-09-25 DIAGNOSIS — G4700 Insomnia, unspecified: Secondary | ICD-10-CM | POA: Insufficient documentation

## 2019-09-25 DIAGNOSIS — Z85828 Personal history of other malignant neoplasm of skin: Secondary | ICD-10-CM | POA: Diagnosis not present

## 2019-09-25 DIAGNOSIS — D124 Benign neoplasm of descending colon: Secondary | ICD-10-CM | POA: Diagnosis not present

## 2019-09-25 DIAGNOSIS — G43909 Migraine, unspecified, not intractable, without status migrainosus: Secondary | ICD-10-CM | POA: Diagnosis not present

## 2019-09-25 DIAGNOSIS — Z8489 Family history of other specified conditions: Secondary | ICD-10-CM | POA: Diagnosis not present

## 2019-09-25 DIAGNOSIS — Z820 Family history of epilepsy and other diseases of the nervous system: Secondary | ICD-10-CM | POA: Insufficient documentation

## 2019-09-25 HISTORY — DX: Unspecified osteoarthritis, unspecified site: M19.90

## 2019-09-25 HISTORY — PX: COLONOSCOPY WITH PROPOFOL: SHX5780

## 2019-09-25 HISTORY — DX: Unspecified asthma, uncomplicated: J45.909

## 2019-09-25 HISTORY — DX: Migraine, unspecified, not intractable, without status migrainosus: G43.909

## 2019-09-25 HISTORY — DX: Motion sickness, initial encounter: T75.3XXA

## 2019-09-25 HISTORY — PX: POLYPECTOMY: SHX5525

## 2019-09-25 SURGERY — COLONOSCOPY WITH PROPOFOL
Anesthesia: Choice | Site: Rectum

## 2019-09-25 MED ORDER — OXYCODONE HCL 5 MG PO TABS: 5.0000 mg | ORAL_TABLET | Freq: Once | ORAL | Status: DC | PRN

## 2019-09-25 MED ORDER — LIDOCAINE HCL (CARDIAC) PF 100 MG/5ML IV SOSY
PREFILLED_SYRINGE | INTRAVENOUS | Status: DC | PRN
  Administered 2019-09-25: 30 mg via INTRAVENOUS

## 2019-09-25 MED ORDER — OXYCODONE HCL 5 MG/5ML PO SOLN: 5.0000 mg | Freq: Once | ORAL | Status: DC | PRN

## 2019-09-25 MED ORDER — STERILE WATER FOR IRRIGATION IR SOLN
Status: DC | PRN
  Administered 2019-09-25: 150 mL

## 2019-09-25 MED ORDER — LACTATED RINGERS IV SOLN: INTRAVENOUS | Status: DC

## 2019-09-25 MED ORDER — PROPOFOL 10 MG/ML IV BOLUS
INTRAVENOUS | Status: DC | PRN
  Administered 2019-09-25 (×2): 50 mg via INTRAVENOUS
  Administered 2019-09-25: 200 mg via INTRAVENOUS
  Administered 2019-09-25: 50 mg via INTRAVENOUS

## 2019-09-25 SURGICAL SUPPLY — 10 items
GOWN CVR UNV OPN BCK APRN NK (MISCELLANEOUS) ×4 IMPLANT
GOWN ISOL THUMB LOOP REG UNIV (MISCELLANEOUS) ×6
KIT DEFENDO VALVE AND CONN (KITS) IMPLANT
KIT ENDO PROCEDURE OLY (KITS) ×3 IMPLANT
MANIFOLD NEPTUNE II (INSTRUMENTS) ×3 IMPLANT
SNARE SHORT THROW 13M SML OVAL (MISCELLANEOUS) ×3 IMPLANT
SNARE SHORT THROW 30M LRG OVAL (MISCELLANEOUS) IMPLANT
SNARE SNG USE RND 15MM (INSTRUMENTS) IMPLANT
TRAP ETRAP POLY (MISCELLANEOUS) ×3 IMPLANT
WATER STERILE IRR 250ML POUR (IV SOLUTION) ×3 IMPLANT

## 2019-09-25 NOTE — Anesthesia Preprocedure Evaluation (Signed)
Anesthesia Evaluation  Patient identified by MRN, date of birth, ID band Patient awake    Reviewed: NPO status   History of Anesthesia Complications Negative for: history of anesthetic complications  Airway Mallampati: II  TM Distance: >3 FB Neck ROM: full    Dental no notable dental hx.    Pulmonary asthma (mild) ,    Pulmonary exam normal        Cardiovascular Exercise Tolerance: Good negative cardio ROS Normal cardiovascular exam     Neuro/Psych  Headaches, negative psych ROS   GI/Hepatic Neg liver ROS, GERD  Controlled,  Endo/Other  negative endocrine ROS  Renal/GU negative Renal ROS  negative genitourinary   Musculoskeletal  (+) Arthritis ,   Abdominal   Peds  Hematology negative hematology ROS (+)   Anesthesia Other Findings Covid: NEG.    Reproductive/Obstetrics                             Anesthesia Physical Anesthesia Plan  ASA: II  Anesthesia Plan:    Post-op Pain Management:    Induction:   PONV Risk Score and Plan: 2 and TIVA and Propofol infusion  Airway Management Planned:   Additional Equipment:   Intra-op Plan:   Post-operative Plan:   Informed Consent: I have reviewed the patients History and Physical, chart, labs and discussed the procedure including the risks, benefits and alternatives for the proposed anesthesia with the patient or authorized representative who has indicated his/her understanding and acceptance.       Plan Discussed with: CRNA  Anesthesia Plan Comments:         Anesthesia Quick Evaluation

## 2019-09-25 NOTE — Anesthesia Procedure Notes (Signed)
Date/Time: 09/25/2019 9:05 AM Performed by: Cameron Ali, CRNA Pre-anesthesia Checklist: Patient identified, Emergency Drugs available, Suction available, Timeout performed and Patient being monitored Patient Re-evaluated:Patient Re-evaluated prior to induction Oxygen Delivery Method: Nasal cannula Placement Confirmation: positive ETCO2

## 2019-09-25 NOTE — H&P (Signed)
Michelle Lame, MD [facility] [address] [place], [place] [address] Phone: [phone] Fax : [phone]  Primary Care Physician:  Glendale Chard, MD Primary Gastroenterologist:  Dr. Allen Norris  Pre-Procedure History & Physical: HPI:  Michelle Mccarthy is a 59 y.o. female is here for a screening colonoscopy.   Past Medical History:  Diagnosis Date  . Arthritis    back  . Asthma   . Atypical mole 11/27/2017   L mid side/mod  . Atypical mole 12/18/2018   L mid to upper back/mod, L lateral buttock/mod  . Family history of ovarian cancer    9/20 genetic testing letter sent  . Hx of basal cell carcinoma 09/18/2016   multiple sites   . Hx of dysplastic nevus 06/11/2017   L ant waistline lateral/excision  . Hx of migraines   . Insomnia   . 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 Migraine headache    2-3/week  . Motion sickness    cars - back seat, circular motion  . Postmenopausal atrophic vaginitis     Past Surgical History:  Procedure Laterality Date  . BACK SURGERY  2016   Microdiskectomy    Prior to Admission medications   Medication Sig Start Date End Date Taking? Authorizing Provider  albuterol (PROVENTIL HFA;VENTOLIN HFA) 108 (90 Base) MCG/ACT inhaler  11/19/17  Yes [provider]  azelastine (ASTELIN) 0.1 % nasal spray USE 1 SPRAY IN EACH NOSTRIL TWICE A DAY NASALLY 30 DAY 09/24/17  Yes [provider]  b complex vitamins capsule Take 1 capsule by mouth daily.   Yes [provider]  BOTOX 100 units SOLR injection  06/02/19  Yes [provider]  Cholecalciferol (VITAMIN D3) 5000 units TABS Take 1 capsule by mouth once.   Yes [provider]  diclofenac (VOLTAREN) 50 MG EC tablet  06/17/19  Yes [provider]  EPINEPHrine 0.3 mg/0.3 mL IJ SOAJ injection AS DIRECTED INJECTION 30 03/31/19  Yes [provider]  Galcanezumab-gnlm (EMGALITY) 120 MG/ML SOAJ Inject into the skin. One injection monthly   Yes  [provider]  imipramine (TOFRANIL) 25 MG tablet Take 25 mg by mouth daily. 06/17/19  Yes [provider]  levocetirizine (XYZAL) 5 MG tablet Take 5 mg by mouth every evening.   Yes [provider]  Magnesium 250 MG TABS Take by mouth. 1 tablet daily   Yes [provider]  Multiple Vitamin (MULTIVITAMIN) tablet Take 1 tablet by mouth daily.   Yes [provider]  omeprazole (PRILOSEC) 20 MG capsule Take 20 mg by mouth daily as needed.   Yes [provider]  POTASSIUM CHLORIDE PO Take 1 tablet by mouth once. 550 mg daily   Yes [provider]  PULMICORT FLEXHALER 180 MCG/ACT inhaler Inhale 2 puffs into the lungs 2 (two) times daily. 08/27/19  Yes [provider]  TURMERIC CURCUMIN PO Take by mouth daily.   Yes [provider]  vitamin E 1000 UNIT capsule Take 400 Units by mouth daily.    Yes [provider]  Sod Picosulfate-Mag Ox-Cit Acd (CLENPIQ) 10-3.5-12 MG-GM -GM/160ML SOLN Take 320 mLs by mouth as directed. 09/18/19   Michelle Lame, MD    Allergies as of 09/02/2019  . (No Known Allergies)    Family History  Problem Relation Age of Onset  . Dementia Mother   . Colon cancer Father   . Ovarian cancer Maternal Aunt 60  . Breast cancer Neg Hx  Social History   Socioeconomic History  . Marital status: Married    Spouse name: Not on file  . Number of children: Not on file  . Years of education: Not on file  . Highest education level: Not on file  Occupational History  . Not on file  Tobacco Use  . Smoking status: Never Smoker  . Smokeless tobacco: Never Used  Vaping Use  . Vaping Use: Never used  Substance and Sexual Activity  . Alcohol use: Yes    Alcohol/week: 2.0 - 4.0 standard drinks    Types: 2 - 4 Standard drinks or equivalent per week  . Drug use: Never  . Sexual activity: Yes    Birth control/protection: None  Other Topics Concern  . Not on file  Social History Narrative    . Not on file   Social Determinants of Health   Financial Resource Strain:   . Difficulty of Paying Living Expenses: Not on file  Food Insecurity:   . Worried About Charity fundraiser in the Last Year: Not on file  . Ran Out of Food in the Last Year: Not on file  Transportation Needs:   . Lack of Transportation (Medical): Not on file  . Lack of Transportation (Non-Medical): Not on file  Physical Activity:   . Days of Exercise per Week: Not on file  . Minutes of Exercise per Session: Not on file  Stress:   . Feeling of Stress : Not on file  Social Connections:   . Frequency of Communication with Friends and Family: Not on file  . Frequency of Social Gatherings with Friends and Family: Not on file  . Attends Religious Services: Not on file  . Active Member of Clubs or Organizations: Not on file  . Attends Archivist Meetings: Not on file  . Marital Status: Not on file  Intimate Partner Violence:   . Fear of Current or Ex-Partner: Not on file  . Emotionally Abused: Not on file  . Physically Abused: Not on file  . Sexually Abused: Not on file    Review of Systems: See HPI, otherwise negative ROS  Physical Exam: BP 125/66   Pulse 82   Temp 97.7 F (36.5 C) (Temporal)   Resp 16   Ht 5\' 9"  (1.753 m)   Wt 78.5 kg   LMP 04/06/2014   SpO2 97%   BMI 25.55 kg/m  General:   Alert,  pleasant and cooperative in NAD Head:  Normocephalic and atraumatic. Neck:  Supple; no masses or thyromegaly. Lungs:  Clear throughout to auscultation.    Heart:  Regular rate and rhythm. Abdomen:  Soft, nontender and nondistended. Normal bowel sounds, without guarding, and without rebound.   Neurologic:  Alert and  oriented x4;  grossly normal neurologically.  Impression/Plan: [name] is now here to undergo a screening colonoscopy.  Risks, benefits, and alternatives regarding colonoscopy have been reviewed with the patient.  Questions have been answered.  All parties  agreeable.

## 2019-09-25 NOTE — Op Note (Signed)
[facility] Gastroenterology Patient Name: Michelle Mccarthy Procedure Date: 09/25/2019 8:56 AM MRN: [id number] Account #: [id number] Date of Birth: 1960/03/23 Admit Type: Outpatient Age: 59 Room: [facility] OR ROOM 01 Gender: Female Note Status: Finalized Procedure:             Colonoscopy Indications:           Screening for colorectal malignant neoplasm, Family                         history of colon cancer in a first-degree relative. Providers:             [name] MD, MD Referring MD:          Glendale Chard (Referring MD) Medicines:             Propofol per Anesthesia Complications:         No immediate complications. Procedure:             Pre-Anesthesia Assessment:                        - Prior to the procedure, a History and Physical was                         performed, and patient medications and allergies were                         reviewed. The patient's tolerance of previous                         anesthesia was also reviewed. The risks and benefits                         of the procedure and the sedation options and risks                         were discussed with the patient. All questions were                         answered, and informed consent was obtained. Prior                         Anticoagulants: The patient has taken no previous                         anticoagulant or antiplatelet agents. ASA Grade                         Assessment: II - A patient with mild systemic disease.                         After reviewing the risks and benefits, the patient                         was deemed in satisfactory condition to undergo the                         procedure.  After obtaining informed consent, the colonoscope was                         passed under direct vision. Throughout the procedure,                         the patient's blood pressure, pulse, and oxygen                         saturations were monitored  continuously. The                         Colonoscope was introduced through the anus and                         advanced to the the cecum, identified by appendiceal                         orifice and ileocecal valve. The colonoscopy was                         performed without difficulty. The patient tolerated                         the procedure well. The quality of the bowel                         preparation was excellent. Findings:      The perianal and digital rectal examinations were normal.      A 4 mm polyp was found in the transverse colon. The polyp was sessile.       The polyp was removed with a cold snare. Resection and retrieval were       complete.      A 4 mm polyp was found in the descending colon. The polyp was sessile.       The polyp was removed with a cold snare. Resection and retrieval were       complete.      Non-bleeding internal hemorrhoids were found during retroflexion. The       hemorrhoids were Grade I (internal hemorrhoids that do not prolapse). Impression:            - One 4 mm polyp in the transverse colon, removed with                         a cold snare. Resected and retrieved.                        - One 4 mm polyp in the descending colon, removed with                         a cold snare. Resected and retrieved.                        - Non-bleeding internal hemorrhoids. Recommendation:        - Discharge patient to home.                        - Resume previous diet.                        -  Continue present medications.                        - Await pathology results.                        - Repeat colonoscopy in 5 years for surveillance. Procedure Code(s):     --- Professional ---                        [phone], Colonoscopy, flexible; with removal of                         tumor(s), polyp(s), or other lesion(s) by snare                         technique Diagnosis Code(s):     --- Professional ---                        Z12.11, Encounter  for screening for malignant neoplasm                         of colon                        K63.5, Polyp of colon CPT copyright 2019 American Medical Association. All rights reserved. The codes documented in this report are preliminary and upon coder review may  be revised to meet current compliance requirements. [name] MD, MD 09/25/2019 9:25:24 AM This report has been signed electronically. Number of Addenda: 0 Note Initiated On: 09/25/2019 8:56 AM Scope Withdrawal Time: 0 hours 6 minutes 37 seconds  Total Procedure Duration: 0 hours 14 minutes 32 seconds  Estimated Blood Loss:  Estimated blood loss: none.      [facility]

## 2019-09-25 NOTE — Transfer of Care (Signed)
Immediate Anesthesia Transfer of Care Note  Patient: BRITTANIE DOSANJH  Procedure(s) Performed: COLONOSCOPY WITH PROPOFOL (N/A ) POLYPECTOMY (Rectum)  Patient Location: PACU  Anesthesia Type: No value filed.  Level of Consciousness: awake, alert  and patient cooperative  Airway and Oxygen Therapy: Patient Spontanous Breathing and Patient connected to supplemental oxygen  Post-op Assessment: Post-op Vital signs reviewed, Patient's Cardiovascular Status Stable, Respiratory Function Stable, Patent Airway and No signs of Nausea or vomiting  Post-op Vital Signs: Reviewed and stable  Complications: No complications documented.

## 2019-09-25 NOTE — Anesthesia Postprocedure Evaluation (Signed)
Anesthesia Post Note  Patient: Michelle Mccarthy  Procedure(s) Performed: COLONOSCOPY WITH PROPOFOL (N/A ) POLYPECTOMY (Rectum)     Patient location during evaluation: PACU Anesthesia Type: General Level of consciousness: awake and alert Pain management: pain level controlled Vital Signs Assessment: post-procedure vital signs reviewed and stable Respiratory status: spontaneous breathing, nonlabored ventilation, respiratory function stable and patient connected to nasal cannula oxygen Cardiovascular status: blood pressure returned to baseline and stable Postop Assessment: no apparent nausea or vomiting Anesthetic complications: no   No complications documented.  Fidel Levy

## 2019-09-28 ENCOUNTER — Encounter: Payer: Self-pay | Admitting: Gastroenterology

## 2019-09-30 LAB — SURGICAL PATHOLOGY

## 2019-10-01 ENCOUNTER — Encounter: Payer: Self-pay | Admitting: Gastroenterology

## 2019-11-05 ENCOUNTER — Ambulatory Visit
Admission: EM | Admit: 2019-11-05 | Discharge: 2019-11-05 | Disposition: A | Discharge status: Home / Self Care | Payer: 59 | Attending: Emergency Medicine | Admitting: Emergency Medicine

## 2019-11-05 ENCOUNTER — Other Ambulatory Visit: Payer: Self-pay

## 2019-11-05 DIAGNOSIS — J45901 Unspecified asthma with (acute) exacerbation: Secondary | ICD-10-CM

## 2019-11-05 DIAGNOSIS — Z1152 Encounter for screening for COVID-19: Secondary | ICD-10-CM | POA: Diagnosis not present

## 2019-11-05 DIAGNOSIS — J0110 Acute frontal sinusitis, unspecified: Secondary | ICD-10-CM

## 2019-11-05 MED ORDER — PREDNISONE 10 MG (21) PO TBPK: ORAL_TABLET | Freq: Every day | ORAL | 0 refills | Status: DC

## 2019-11-05 MED ORDER — AZITHROMYCIN 250 MG PO TABS: 250.0000 mg | ORAL_TABLET | Freq: Every day | ORAL | 0 refills | Status: DC

## 2019-11-05 NOTE — Discharge Instructions (Addendum)
Take the prednisone and Zithromax as directed.  Follow up with your primary care provider if your symptoms are not improving.    

## 2019-11-05 NOTE — ED Triage Notes (Signed)
Pt sts she has been having some sneezing, nasal and head congestion since Sunday. "I think it is a sinus infection, I have allergies".  sts she has been using her inhaler more than normal.

## 2019-11-05 NOTE — ED Provider Notes (Signed)
Roderic Pal[facility]    CSN: [id number] Arrival date & time: 11/05/19  2992      History   Chief Complaint Chief Complaint  Patient presents with   Nasal Congestion    HPI Michelle Mccarthy is a 59 y.o. female.   Patient presents with 5-day history of sneezing, nonproductive cough, nasal congestion, postnasal drip, rhinorrhea.  She has history of asthma and feels tight with her breathing.  She has attempted treatment at home with her albuterol inhaler.  She denies fever, chills, shortness of breath, vomiting, diarrhea, or other symptoms.  Her medical history also includes seasonal allergies, migraines, insomnia, arthritis.  The history is provided by the patient and medical records.    Past Medical History:  Diagnosis Date   Arthritis    back   Asthma    Atypical mole 11/27/2017   L mid side/mod   Atypical mole 12/18/2018   L mid to upper back/mod, L lateral buttock/mod   Family history of ovarian cancer    9/20 genetic testing letter sent   Hx of basal cell carcinoma 09/18/2016   multiple sites    Hx of dysplastic nevus 06/11/2017   L ant waistline lateral/excision   Hx of migraines    Insomnia    Menopausal and female climacteric states    Migraine headache    2-3/week   Motion sickness    cars - back seat, circular motion   Postmenopausal atrophic vaginitis     Patient Active Problem List   Diagnosis Date Noted   Special screening for malignant neoplasms, colon    Family history of benign neoplasm of colon    Seasonal allergies 03/11/2018   Postmenopausal atrophic vaginitis 03/11/2018   Female climacteric state 11/06/2017    Past Surgical History:  Procedure Laterality Date   BACK SURGERY  2016   Microdiskectomy   COLONOSCOPY WITH PROPOFOL N/A 09/25/2019   Procedure: COLONOSCOPY WITH PROPOFOL;  Surgeon: [name], MD;  Location: [facility];  Service: Endoscopy;  Laterality: N/A;  priority 4   POLYPECTOMY  09/25/2019    Procedure: POLYPECTOMY;  Surgeon: [name], MD;  Location: [place];  Service: Endoscopy;;    OB History    Gravida  1   Para      Term      Preterm      AB  1   Living        SAB      TAB  1   Ectopic      Multiple      Live Births  0            Home Medications    Prior to Admission medications   Medication Sig Start Date End Date Taking? Authorizing Provider  albuterol (PROVENTIL HFA;VENTOLIN HFA) 108 (90 Base) MCG/ACT inhaler  11/19/17   [provider]  azelastine (ASTELIN) 0.1 % nasal spray USE 1 SPRAY IN EACH NOSTRIL TWICE A DAY NASALLY 30 DAY 09/24/17   [provider]  azithromycin (ZITHROMAX) 250 MG tablet Take 1 tablet (250 mg total) by mouth daily. Take first 2 tablets together, then 1 every day until finished. 11/05/19   Sharion Balloon, NP  b complex vitamins capsule Take 1 capsule by mouth daily.    [provider]  BOTOX 100 units SOLR injection  06/02/19   [provider]  Cholecalciferol (VITAMIN D3) 5000 units TABS Take 1 capsule by mouth once.    [provider]  diclofenac (VOLTAREN) 50 MG EC tablet  06/17/19   [provider]  EPINEPHrine 0.3 mg/0.3 mL IJ SOAJ injection AS DIRECTED INJECTION 30 03/31/19   [provider]  Galcanezumab-gnlm (EMGALITY) 120 MG/ML SOAJ Inject into the skin. One injection monthly    [provider]  imipramine (TOFRANIL) 25 MG tablet Take 25 mg by mouth daily. 06/17/19   [provider]  levocetirizine (XYZAL) 5 MG tablet Take 5 mg by mouth every evening.    [provider]  Magnesium 250 MG TABS Take by mouth. 1 tablet daily    [provider]  Multiple Vitamin (MULTIVITAMIN) tablet Take 1 tablet by mouth daily.    [provider]  omeprazole (PRILOSEC) 20 MG capsule Take 20 mg by mouth daily as needed.    [provider]  POTASSIUM CHLORIDE PO Take 1 tablet by mouth once. 550 mg daily     [provider]  predniSONE (STERAPRED UNI-PAK 21 TAB) 10 MG (21) TBPK tablet Take by mouth daily. As directed 11/05/19   Sharion Balloon, NP  PULMICORT FLEXHALER 180 MCG/ACT inhaler Inhale 2 puffs into the lungs 2 (two) times daily. 08/27/19   [provider]  Sod Picosulfate-Mag Ox-Cit Acd (CLENPIQ) 10-3.5-12 MG-GM -GM/160ML SOLN Take 320 mLs by mouth as directed. 09/18/19   [name], MD  TURMERIC CURCUMIN PO Take by mouth daily.    [provider]  vitamin E 1000 UNIT capsule Take 400 Units by mouth daily.     [provider]    Family History Family History  Problem Relation Age of Onset   Dementia Mother    Colon cancer Father    Ovarian cancer Maternal Aunt 60   Breast cancer Neg Hx     Social History Social History   Tobacco Use   Smoking status: Never Smoker   Smokeless tobacco: Never Used  Vaping Use   Vaping Use: Never used  Substance Use Topics   Alcohol use: Yes    Alcohol/week: 2.0 - 4.0 standard drinks    Types: 2 - 4 Standard drinks or equivalent per week    Comment: occ   Drug use: Never     Allergies   Patient has no known allergies.   Review of Systems Review of Systems  Constitutional: Negative for chills and fever.  HENT: Positive for congestion, postnasal drip, rhinorrhea, sinus pressure, sneezing and sore throat. Negative for ear pain.   Eyes: Negative for pain and visual disturbance.  Respiratory: Positive for cough. Negative for shortness of breath.   Cardiovascular: Negative for chest pain and palpitations.  Gastrointestinal: Negative for abdominal pain, diarrhea and vomiting.  Genitourinary: Negative for dysuria and hematuria.  Musculoskeletal: Negative for arthralgias and back pain.  Skin: Negative for color change and rash.  Neurological: Negative for seizures and syncope.  All other systems reviewed and are negative.    Physical Exam Triage Vital Signs ED Triage Vitals  Enc Vitals Group       BP 11/05/19 1004 136/88     Pulse --      Resp 11/05/19 1004 16     Temp 11/05/19 1004 99.3 F (37.4 C)     Temp Source 11/05/19 1004 Oral     SpO2 11/05/19 1004 95 %     Weight 11/05/19 1005 175 lb (79.4 kg)     Height 11/05/19 1005 5\' 9"  (1.753 m)     Head Circumference --      Peak Flow --  Pain Score 11/05/19 1005 10     Pain Loc --      Pain Edu? --      Excl. in [place]? --    No data found.  Updated Vital Signs BP 136/88    Temp 99.3 F (37.4 C) (Oral)    Resp 16    Ht 5\' 9"  (1.753 m)    Wt 175 lb (79.4 kg)    LMP 04/06/2014    SpO2 95%    BMI 25.84 kg/m   Visual Acuity Right Eye Distance:   Left Eye Distance:   Bilateral Distance:    Right Eye Near:   Left Eye Near:    Bilateral Near:     Physical Exam Vitals and nursing note reviewed.  Constitutional:      General: She is not in acute distress.    Appearance: She is well-developed. She is not ill-appearing.  HENT:     Head: Normocephalic and atraumatic.     Right Ear: Tympanic membrane normal.     Left Ear: Tympanic membrane normal.     Nose: Congestion and rhinorrhea present.     Mouth/Throat:     Mouth: Mucous membranes are moist.     Pharynx: Oropharynx is clear.  Eyes:     Conjunctiva/sclera: Conjunctivae normal.  Cardiovascular:     Rate and Rhythm: Normal rate and regular rhythm.     Heart sounds: No murmur heard.   Pulmonary:     Effort: Pulmonary effort is normal. No respiratory distress.     Breath sounds: Normal breath sounds.     Comments: Lung sounds diminished throughout. Abdominal:     Palpations: Abdomen is soft.     Tenderness: There is no abdominal tenderness. There is no guarding or rebound.  Musculoskeletal:     Cervical back: Neck supple.  Skin:    General: Skin is warm and dry.     Findings: No rash.  Neurological:     General: No focal deficit present.     Mental Status: She is alert and oriented to person, place, and time.     Gait: Gait normal.  Psychiatric:         Mood and Affect: Mood normal.        Behavior: Behavior normal.      UC Treatments / Results  Labs (all labs ordered are listed, but only abnormal results are displayed) Labs Reviewed  NOVEL CORONAVIRUS, NAA    EKG   Radiology No results found.  Procedures Procedures (including critical care time)  Medications Ordered in UC Medications - No data to display  Initial Impression / Assessment and Plan / UC Course  I have reviewed the triage vital signs and the nursing notes.  Pertinent labs & imaging results that were available during my care of the patient were reviewed by me and considered in my medical decision making (see chart for details).   Acute asthma exacerbation, sinusitis.  PCR COVID pending.  Instructed patient to self quarantine until the test result is back.  Treating with prednisone and Zithromax.  Patient already has an albuterol inhaler and is instructed to use it regularly for the next 2 to 3 days.  Instructed her to follow-up with her PCP if her symptoms are not improving.  Patient agrees to plan of care.   Final Clinical Impressions(s) / UC Diagnoses   Final diagnoses:  Encounter for screening for COVID-19  Asthma with acute exacerbation, unspecified asthma severity, unspecified whether persistent  Acute  non-recurrent frontal sinusitis     Discharge Instructions     Take the prednisone and Zithromax as directed.    Follow up with your primary care provider if your symptoms are not improving.       ED Prescriptions    Medication Sig Dispense Auth. Provider   azithromycin (ZITHROMAX) 250 MG tablet Take 1 tablet (250 mg total) by mouth daily. Take first 2 tablets together, then 1 every day until finished. 6 tablet Sharion Balloon, NP   predniSONE (STERAPRED UNI-PAK 21 TAB) 10 MG (21) TBPK tablet Take by mouth daily. As directed 21 tablet Sharion Balloon, NP     PDMP not reviewed this encounter.   Sharion Balloon, NP 11/05/19 1037

## 2019-11-06 LAB — SARS-COV-2, NAA 2 DAY TAT

## 2019-11-06 LAB — NOVEL CORONAVIRUS, NAA: SARS-CoV-2, NAA: NOT DETECTED

## 2019-12-02 ENCOUNTER — Other Ambulatory Visit: Payer: Self-pay | Admitting: Internal Medicine

## 2019-12-02 DIAGNOSIS — Z1231 Encounter for screening mammogram for malignant neoplasm of breast: Secondary | ICD-10-CM

## 2019-12-30 ENCOUNTER — Ambulatory Visit: Payer: 59 | Admitting: Dermatology

## 2019-12-30 ENCOUNTER — Encounter: Payer: Self-pay | Admitting: Dermatology

## 2019-12-30 ENCOUNTER — Other Ambulatory Visit: Payer: Self-pay

## 2019-12-30 DIAGNOSIS — L304 Erythema intertrigo: Secondary | ICD-10-CM | POA: Diagnosis not present

## 2019-12-30 DIAGNOSIS — Z86018 Personal history of other benign neoplasm: Secondary | ICD-10-CM

## 2019-12-30 DIAGNOSIS — L820 Inflamed seborrheic keratosis: Secondary | ICD-10-CM | POA: Diagnosis not present

## 2019-12-30 DIAGNOSIS — Z1283 Encounter for screening for malignant neoplasm of skin: Secondary | ICD-10-CM

## 2019-12-30 DIAGNOSIS — D1800 Hemangioma unspecified site: Secondary | ICD-10-CM

## 2019-12-30 DIAGNOSIS — D692 Other nonthrombocytopenic purpura: Secondary | ICD-10-CM

## 2019-12-30 DIAGNOSIS — L739 Follicular disorder, unspecified: Secondary | ICD-10-CM | POA: Diagnosis not present

## 2019-12-30 DIAGNOSIS — D485 Neoplasm of uncertain behavior of skin: Secondary | ICD-10-CM

## 2019-12-30 DIAGNOSIS — L814 Other melanin hyperpigmentation: Secondary | ICD-10-CM

## 2019-12-30 DIAGNOSIS — D2272 Melanocytic nevi of left lower limb, including hip: Secondary | ICD-10-CM | POA: Diagnosis not present

## 2019-12-30 DIAGNOSIS — L821 Other seborrheic keratosis: Secondary | ICD-10-CM

## 2019-12-30 DIAGNOSIS — D229 Melanocytic nevi, unspecified: Secondary | ICD-10-CM

## 2019-12-30 DIAGNOSIS — L578 Other skin changes due to chronic exposure to nonionizing radiation: Secondary | ICD-10-CM

## 2019-12-30 DIAGNOSIS — Z85828 Personal history of other malignant neoplasm of skin: Secondary | ICD-10-CM

## 2019-12-30 NOTE — Patient Instructions (Signed)

## 2019-12-30 NOTE — Progress Notes (Signed)
Follow-Up Visit   Subjective  Michelle Mccarthy is a 59 y.o. female who presents for the following: Annual Exam (Hx BCC, dysplastic nevi ). Patient c/o itchy scalp that hurts when she scratches it. The patient presents for Total-Body Skin Exam (TBSE) for skin cancer screening and mole check.  The following portions of the chart were reviewed this encounter and updated as appropriate:   Tobacco  Allergies  Meds  Problems  Med Hx  Surg Hx  Fam Hx     Review of Systems:  No other skin or systemic complaints except as noted in HPI or Assessment and Plan.  Objective  Well appearing patient in no apparent distress; mood and affect are within normal limits.  A full examination was performed including scalp, head, eyes, ears, nose, lips, neck, chest, axillae, abdomen, back, buttocks, bilateral upper extremities, bilateral lower extremities, hands, feet, fingers, toes, fingernails, and toenails. All findings within normal limits unless otherwise noted below.  Objective  R deltoid x 1: Erythematous keratotic or waxy stuck-on papule or plaque.   Objective  Scalp: Resolving papule.  Objective  Inframammary: Erythema  Objective  L med mid sole at the arch:  0.6 cm rregular brown macule   Assessment & Plan  Inflamed seborrheic keratosis R deltoid x 1  Destruction of lesion - R deltoid x 1 Complexity: simple   Destruction method: cryotherapy   Informed consent: discussed and consent obtained   Timeout:  patient name, date of birth, surgical site, and procedure verified Lesion destroyed using liquid nitrogen: Yes   Region frozen until ice ball extended beyond lesion: Yes   Outcome: patient tolerated procedure well with no complications   Post-procedure details: wound care instructions given    Folliculitis Scalp /rosacea -chronic, persistent.  Treatable but not likely curable. Recommend CLN shampoo daily.   Erythema intertrigo Inframammary Continue Skin Medicinals mix  Iodoquinol/HC/Niacinamide BID PRN. Recommend Zeasorb AF powder daily.  Intertrigo is a chronic recurrent rash that occurs in skin fold areas that may be associated with friction; heat; moisture; yeast; fungus; and bacteria.  It is exacerbated by increased movement / activity; sweating; and higher atmospheric temperature.  Neoplasm of uncertain behavior of skin L med mid sole at the arch Epidermal / dermal shaving  Lesion diameter (cm):  0.6 Informed consent: discussed and consent obtained   Timeout: patient name, date of birth, surgical site, and procedure verified   Procedure prep:  Patient was prepped and draped in usual sterile fashion Prep type:  Isopropyl alcohol Anesthesia: the lesion was anesthetized in a standard fashion   Anesthetic:  1% lidocaine w/ epinephrine 1-100,000 buffered w/ 8.4% NaHCO3 Instrument used: flexible razor blade   Hemostasis achieved with: pressure, aluminum chloride and electrodesiccation   Outcome: patient tolerated procedure well   Post-procedure details: sterile dressing applied and wound care instructions given   Dressing type: bandage and petrolatum    Specimen 1 - Surgical pathology Differential Diagnosis: D48.5 r/o dysplastic nevus  Check Margins: No 0.6 cm irregular brown macule  Skin cancer screening   Lentigines - Scattered tan macules - Discussed due to sun exposure - Benign, observe - Call for any changes  Seborrheic Keratoses - Stuck-on, waxy, tan-brown papules and plaques  - Discussed benign etiology and prognosis. - Observe - Call for any changes  Melanocytic Nevi - Tan-brown and/or pink-flesh-colored symmetric macules and papules - Benign appearing on exam today - Observation - Call clinic for new or changing moles - Recommend daily use of broad  spectrum spf 30+ sunscreen to sun-exposed areas.   Hemangiomas - Red papules - Discussed benign nature - Observe - Call for any changes  Actinic Damage - Chronic, secondary to  cumulative UV/sun exposure - diffuse scaly erythematous macules with underlying dyspigmentation - Recommend daily broad spectrum sunscreen SPF 30+ to sun-exposed areas, reapply every 2 hours as needed.  - Call for new or changing lesions.  History of Basal Cell Carcinoma of the Skin - No evidence of recurrence today - Recommend regular full body skin exams - Recommend daily broad spectrum sunscreen SPF 30+ to sun-exposed areas, reapply every 2 hours as needed.  - Call if any new or changing lesions are noted between office visits  History of Dysplastic Nevi - No evidence of recurrence today - Recommend regular full body skin exams - Recommend daily broad spectrum sunscreen SPF 30+ to sun-exposed areas, reapply every 2 hours as needed.  - Call if any new or changing lesions are noted between office visits  Purpura - Chronic; persistent and recurrent.  Treatable, but not curable. - Violaceous macules and patches - Benign - Related to trauma, age, sun damage and/or use of blood thinners, chronic use of topical and/or oral steroids - Observe - Can use OTC arnica containing moisturizer such as Dermend Bruise Formula if desired - Call for worsening or other concerns  Skin cancer screening performed today.  Return in about 6 months (around 06/29/2020) for TBSE - hx BCC, dysplastic nevi .  Luther Redo, CMA, am acting as scribe for Sarina Ser, MD .  Documentation: I have reviewed the above documentation for accuracy and completeness, and I agree with the above.  Sarina Ser, MD

## 2020-01-05 ENCOUNTER — Telehealth: Payer: Self-pay

## 2020-01-05 NOTE — Telephone Encounter (Signed)
-----   Message from Deirdre Evener, MD sent at 01/04/2020  4:54 PM EST ----- Diagnosis Skin , left med mid sole at the arch MELANOCYTIC NEVUS, JUNCTIONAL ACRAL TYPE  Benign mole No further treatment needed

## 2020-01-05 NOTE — Telephone Encounter (Signed)
Left message on voicemail to return my call.  

## 2020-01-06 ENCOUNTER — Telehealth: Payer: Self-pay

## 2020-01-06 NOTE — Telephone Encounter (Signed)
-----   Message from David C Kowalski, MD sent at 01/04/2020  4:54 PM EST ----- °Diagnosis °Skin , left med mid sole at the arch °MELANOCYTIC NEVUS, JUNCTIONAL ACRAL TYPE ° °Benign mole °No further treatment needed °

## 2020-01-06 NOTE — Telephone Encounter (Signed)
Patient advised of BX results.  °

## 2020-01-07 ENCOUNTER — Encounter: Payer: Self-pay | Admitting: Dermatology

## 2020-01-26 ENCOUNTER — Telehealth (INDEPENDENT_AMBULATORY_CARE_PROVIDER_SITE_OTHER): Payer: 59 | Admitting: Internal Medicine

## 2020-01-26 ENCOUNTER — Encounter: Payer: Self-pay | Admitting: Internal Medicine

## 2020-01-26 ENCOUNTER — Other Ambulatory Visit: Payer: Self-pay

## 2020-01-26 VITALS — Ht 69.0 in

## 2020-01-26 DIAGNOSIS — Z8616 Personal history of COVID-19: Secondary | ICD-10-CM

## 2020-01-26 DIAGNOSIS — N951 Menopausal and female climacteric states: Secondary | ICD-10-CM

## 2020-01-26 NOTE — Telephone Encounter (Signed)
The patient gave consent for a virtual appt.

## 2020-01-26 NOTE — Progress Notes (Signed)
Virtual Visit via Video   This visit type was conducted due to national recommendations for restrictions regarding the COVID-19 Pandemic (e.g. social distancing) in an effort to limit this patient's exposure and mitigate transmission in our community.  Due to her co-morbid illnesses, this patient is at least at moderate risk for complications without adequate follow up.  This format is felt to be most appropriate for this patient at this time.  All issues noted in this document were discussed and addressed.  A limited physical exam was performed with this format.    This visit type was conducted due to national recommendations for restrictions regarding the COVID-19 Pandemic (e.g. social distancing) in an effort to limit this patient's exposure and mitigate transmission in our community.  Patients identity confirmed using two different identifiers.  This format is felt to be most appropriate for this patient at this time.  All issues noted in this document were discussed and addressed.  No physical exam was performed (except for noted visual exam findings with Video Visits).    Date:  02/11/2020   ID:  Michelle Mccarthy, DOB 04-18-1960, MRN [id number]  Patient Location:  Work  Provider location:   Theatre stage manager Complaint:  "I have [place] f/u"  History of Present Illness:    Michelle Mccarthy is a 60 y.o. female who presents via video conferencing for a telehealth visit today.    The patient does not have symptoms concerning for COVID-19 infection (fever, chills, cough, or new shortness of breath).   She presents today for f/u BHRT. She feels well on her current BHRT regimen. She reports compliance with estriol/testosterone topically and oral progesterone. She has not had any issues with her regimen. She reports she has an upcoming mammogram. Unfortunately, she lost both of her parents in the past year, most recently her father in Nov 2021.     Past Medical History:  Diagnosis Date  . Arthritis     back  . Asthma   . Atypical mole 11/27/2017   L mid side/mod  . Atypical mole 12/18/2018   L mid to upper back/mod, L lateral buttock/mod  . Family history of ovarian cancer    9/20 genetic testing letter sent  . Hx of basal cell carcinoma 09/18/2016   multiple sites   . Hx of dysplastic nevus 06/11/2017   L ant waistline lateral/excision  . Hx of migraines   . Insomnia   . 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 Migraine headache    2-3/week  . Motion sickness    cars - back seat, circular motion  . Postmenopausal atrophic vaginitis    Past Surgical History:  Procedure Laterality Date  . BACK SURGERY  2016   Microdiskectomy  . COLONOSCOPY WITH PROPOFOL N/A 09/25/2019   Procedure: COLONOSCOPY WITH PROPOFOL;  Surgeon: [name], MD;  Location: [place];  Service: Endoscopy;  Laterality: N/A;  priority 4  . POLYPECTOMY  09/25/2019   Procedure: POLYPECTOMY;  Surgeon: [name], MD;  Location: [place];  Service: Endoscopy;;     Current Meds  Medication Sig  . albuterol (PROVENTIL HFA;VENTOLIN HFA) 108 (90 Base) MCG/ACT inhaler   . azelastine (ASTELIN) 0.1 % nasal spray USE 1 SPRAY IN EACH NOSTRIL TWICE A DAY NASALLY 30 DAY  . b complex vitamins capsule Take 1 capsule by mouth daily.  Marland Kitchen BOTOX 100 units SOLR injection   . Cholecalciferol (VITAMIN D3) 5000 units TABS Take 1 capsule by mouth  once.  Marland Kitchen EPINEPHrine 0.3 mg/0.3 mL IJ SOAJ injection AS DIRECTED INJECTION 30  . Galcanezumab-gnlm 120 MG/ML SOAJ Inject into the skin. One injection monthly  . imipramine (TOFRANIL) 25 MG tablet Take 25 mg by mouth daily.  Marland Kitchen levocetirizine (XYZAL) 5 MG tablet Take 5 mg by mouth every evening.  . Magnesium 250 MG TABS Take by mouth. 1 tablet daily  . Multiple Vitamin (MULTIVITAMIN) tablet Take 1 tablet by mouth daily.  Marland Kitchen omeprazole (PRILOSEC) 20 MG capsule Take 20 mg by mouth daily as needed.  Marland Kitchen POTASSIUM CHLORIDE PO Take 1 tablet by mouth once. 550 mg daily   . PULMICORT FLEXHALER 180 MCG/ACT inhaler Inhale 2 puffs into the lungs 2 (two) times daily.  . TURMERIC CURCUMIN PO Take by mouth daily.  . vitamin E 1000 UNIT capsule Take 400 Units by mouth daily.      Allergies:   Patient has no known allergies.   Social History   Tobacco Use  . Smoking status: Never Smoker  . Smokeless tobacco: Never Used  Vaping Use  . Vaping Use: Never used  Substance Use Topics  . Alcohol use: Yes    Alcohol/week: 2.0 - 4.0 standard drinks    Types: 2 - 4 Standard drinks or equivalent per week    Comment: occ  . Drug use: Never     Family Hx: The patient's family history includes Colon cancer in her father; Dementia in her mother; Ovarian cancer (age of onset: 25) in her maternal aunt. There is no history of Breast cancer.  ROS:   Please see the history of present illness.    Review of Systems  Constitutional: Negative.   HENT:       She reports having COVID last week. She reports having mild symptoms.   Respiratory: Negative.   Cardiovascular: Negative.   Gastrointestinal: Negative.   Neurological: Negative.   Psychiatric/Behavioral: Negative.     All other systems reviewed and are negative.   Labs/Other Tests and Data Reviewed:    Recent Labs: 03/19/2019: Hemoglobin 13.6; Platelets 326 07/23/2019: ALT 17; BUN 15; Creatinine, Ser 0.75; Potassium 4.8; Sodium 140   Recent Lipid Panel No results found for: CHOL, TRIG, HDL, CHOLHDL, LDLCALC, LDLDIRECT  Wt Readings from Last 3 Encounters:  11/05/19 175 lb (79.4 kg)  09/25/19 173 lb (78.5 kg)  07/23/19 173 lb 12.8 oz (78.8 kg)     Exam:    Vital Signs:  Ht 5\' 9"  (1.753 m)   LMP 04/06/2014   BMI 25.84 kg/m     Physical Exam Vitals and nursing note reviewed.  Constitutional:      Appearance: Normal appearance.  HENT:     Head: Normocephalic and atraumatic.  Pulmonary:     Effort: Pulmonary effort is normal.  Musculoskeletal:     Cervical back: Normal range of motion.   Neurological:     Mental Status: She is alert and oriented to person, place, and time.  Psychiatric:        Mood and Affect: Affect normal.     ASSESSMENT & PLAN:    1. 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 I will call in refills into [facility]. She will f/u in 4 months for re-evaluation. Encouraged to keep upcoming mammo appt.   2. Personal history of COVID-19 Comments: She is fully vaccinated and encouraged to get booster in 30 days.   COVID-19 Education: The signs and symptoms of COVID-19 were discussed with the patient and how to  seek care for testing (follow up with PCP or arrange E-visit).  The importance of social distancing was discussed today.  Patient Risk:   After full review of this patients clinical status, I feel that they are at least moderate risk at this time.  Time:   Today, I have spent 12 minutes with the patient with telehealth technology discussing above diagnoses.     Medication Adjustments/Labs and Tests Ordered: Current medicines are reviewed at length with the patient today.  Concerns regarding medicines are outlined above.   Tests Ordered: No orders of the defined types were placed in this encounter.   Medication Changes: No orders of the defined types were placed in this encounter.   Disposition:  Follow up in 3 month(s)  Signed, [name], MD

## 2020-01-26 NOTE — Patient Instructions (Signed)
Managing Loss, Adult People experience loss in many different ways throughout their lives. Events such as moving, changing jobs, and losing friends can create a sense of loss. The loss may be as serious as a major health change, divorce, death of a pet, or death of a loved one. All of these types of loss are likely to create a physical and emotional reaction known as grief. Grief is the result of a major change or an absence of something or someone that you count on. Grief is a normal reaction to loss. A variety of factors can affect your grieving experience, including:  The nature of your loss.  Your relationship to what or whom you lost.  Your understanding of grief and how to manage it.  Your support system. How to manage lifestyle changes Keep to your normal routine as much as possible.  If you have trouble focusing or doing normal activities, it is acceptable to take some time away from your normal routine.  Spend time with friends and loved ones.  Eat a healthy diet, get plenty of sleep, and rest when you feel tired.   How to recognize changes  The way that you deal with your grief will affect your ability to function as you normally do. When grieving, you may experience these changes:  Numbness, shock, sadness, anxiety, anger, denial, and guilt.  Thoughts about death.  Unexpected crying.  A physical sensation of emptiness in your stomach.  Problems sleeping and eating.  Tiredness (fatigue).  Loss of interest in normal activities.  Dreaming about or imagining seeing the person who died.  A need to remember what or whom you lost.  Difficulty thinking about anything other than your loss for a period of time.  Relief. If you have been expecting the loss for a while, you may feel a sense of relief when it happens. Follow these instructions at home: Activity Express your feelings in healthy ways, such as:  Talking with others about your loss. It may be helpful to find  others who have had a similar loss, such as a support group.  Writing down your feelings in a journal.  Doing physical activities to release stress and emotional energy.  Doing creative activities like painting, sculpting, or playing or listening to music.  Practicing resilience. This is the ability to recover and adjust after facing challenges. Reading some resources that encourage resilience may help you to learn ways to practice those behaviors.   General instructions  Be patient with yourself and others. Allow the grieving process to happen, and remember that grieving takes time. ? It is likely that you may never feel completely done with some grief. You may find a way to move on while still cherishing memories and feelings about your loss. ? Accepting your loss is a process. It can take months or longer to adjust.  Keep all follow-up visits as told by your health care provider. This is important. Where to find support To get support for managing loss:  Ask your health care provider for help and recommendations, such as grief counseling or therapy.  Think about joining a support group for people who are managing a loss. Where to find more information You can find more information about managing loss from:  American Society of Clinical Oncology: www.cancer.net  American Psychological Association: www.apa.org Contact a health care provider if:  Your grief is extreme and keeps getting worse.  You have ongoing grief that does not improve.  Your body shows symptoms   of grief, such as illness.  You feel depressed, anxious, or lonely. Get help right away if:  You have thoughts about hurting yourself or others. If you ever feel like you may hurt yourself or others, or have thoughts about taking your own life, get help right away. You can go to your nearest emergency department or call:  Your local emergency services (911 in the [place]  A suicide crisis helpline, such as the  National Suicide Prevention Lifeline at 1-800-273-8255. This is open 24 hours a day. Summary  Grief is the result of a major change or an absence of someone or something that you count on. Grief is a normal reaction to loss.  The depth of grief and the period of recovery depend on the type of loss and your ability to adjust to the change and process your feelings.  Processing grief requires patience and a willingness to accept your feelings and talk about your loss with people who are supportive.  It is important to find resources that work for you and to realize that people experience grief differently. There is not one grieving process that works for everyone in the same way.  Be aware that when grief becomes extreme, it can lead to more severe issues like isolation, depression, anxiety, or suicidal thoughts. Talk with your health care provider if you have any of these issues. This information is not intended to replace advice given to you by your health care provider. Make sure you discuss any questions you have with your health care provider. Document Revised: 06/18/2019 Document Reviewed: 06/18/2019 Elsevier Patient Education  2021 Elsevier Inc.  

## 2020-01-28 ENCOUNTER — Ambulatory Visit: Payer: 59 | Admitting: Internal Medicine

## 2020-02-02 ENCOUNTER — Ambulatory Visit: Payer: 59 | Admitting: Internal Medicine

## 2020-02-09 ENCOUNTER — Other Ambulatory Visit: Payer: Self-pay

## 2020-02-09 ENCOUNTER — Ambulatory Visit
Admission: RE | Admit: 2020-02-09 | Discharge: 2020-02-09 | Disposition: A | Discharge status: Home / Self Care | Payer: 59 | Source: Ambulatory Visit | Attending: Internal Medicine | Admitting: Internal Medicine

## 2020-02-09 DIAGNOSIS — Z1231 Encounter for screening mammogram for malignant neoplasm of breast: Secondary | ICD-10-CM | POA: Diagnosis present

## 2020-02-10 ENCOUNTER — Other Ambulatory Visit: Payer: Self-pay | Admitting: Internal Medicine

## 2020-02-10 DIAGNOSIS — R928 Other abnormal and inconclusive findings on diagnostic imaging of breast: Secondary | ICD-10-CM

## 2020-02-19 ENCOUNTER — Ambulatory Visit
Admission: RE | Admit: 2020-02-19 | Discharge: 2020-02-19 | Disposition: A | Discharge status: Home / Self Care | Payer: 59 | Source: Ambulatory Visit | Attending: Internal Medicine | Admitting: Internal Medicine

## 2020-02-19 ENCOUNTER — Other Ambulatory Visit: Payer: Self-pay

## 2020-02-19 DIAGNOSIS — R928 Other abnormal and inconclusive findings on diagnostic imaging of breast: Secondary | ICD-10-CM

## 2020-04-20 HISTORY — PX: LUMBAR FUSION: SHX111

## 2020-05-05 ENCOUNTER — Ambulatory Visit (INDEPENDENT_AMBULATORY_CARE_PROVIDER_SITE_OTHER): Payer: 59 | Admitting: Internal Medicine

## 2020-05-05 ENCOUNTER — Encounter: Payer: Self-pay | Admitting: Internal Medicine

## 2020-05-05 ENCOUNTER — Ambulatory Visit: Payer: 59 | Admitting: Internal Medicine

## 2020-05-05 ENCOUNTER — Other Ambulatory Visit: Payer: Self-pay

## 2020-05-05 VITALS — BP 112/74 | HR 60 | Temp 98.2°F | Ht 69.0 in | Wt 181.4 lb

## 2020-05-05 DIAGNOSIS — D692 Other nonthrombocytopenic purpura: Secondary | ICD-10-CM | POA: Insufficient documentation

## 2020-05-05 DIAGNOSIS — N951 Menopausal and female climacteric states: Secondary | ICD-10-CM | POA: Diagnosis not present

## 2020-05-05 DIAGNOSIS — R5383 Other fatigue: Secondary | ICD-10-CM

## 2020-05-05 NOTE — Progress Notes (Signed)
I,Katawbba Wiggins,acting as a Education administrator for [name], MD.,have documented all relevant documentation on the behalf of [name], MD,as directed by  [name], MD while in the presence of [name], MD.  This visit occurred during the SARS-CoV-2 public health emergency.  Safety protocols were in place, including screening questions prior to the visit, additional usage of staff PPE, and extensive cleaning of exam room while observing appropriate contact time as indicated for disinfecting solutions.  Subjective:     Patient ID: Michelle Mccarthy , female    DOB: 1960/05/18 , 60 y.o.   MRN: [id number]   Chief Complaint  Patient presents with  . Hormones f/u    HPI  She presents today for f/u on her hormones.  She would like to have her thyroid checked. She has been having "low energy". However, this has been a chronic issue. She adds that she had lumbar spinal fusion performed in April. She feels she is doing well, it has only been two weeks. She will start PT in 3 weeks.     Past Medical History:  Diagnosis Date  . Arthritis    back  . Asthma   . Atypical mole 11/27/2017   L mid side/mod  . Atypical mole 12/18/2018   L mid to upper back/mod, L lateral buttock/mod  . Family history of ovarian cancer    9/20 genetic testing letter sent  . Hx of basal cell carcinoma 09/18/2016   multiple sites   . Hx of dysplastic nevus 06/11/2017   L ant waistline lateral/excision  . Hx of migraines   . Insomnia   . 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 Migraine headache    2-3/week  . Motion sickness    cars - back seat, circular motion  . Postmenopausal atrophic vaginitis      Family History  Problem Relation Age of Onset  . Dementia Mother   . Colon cancer Father   . Ovarian cancer Maternal Aunt 60  . Breast cancer Neg Hx      Current Outpatient Medications:  .  albuterol (PROVENTIL HFA;VENTOLIN HFA) 108 (90 Base) MCG/ACT inhaler, , Disp: , Rfl:  .   azelastine (ASTELIN) 0.1 % nasal spray, USE 1 SPRAY IN EACH NOSTRIL TWICE A DAY NASALLY 30 DAY, Disp: , Rfl: 5 .  b complex vitamins capsule, Take 1 capsule by mouth daily., Disp: , Rfl:  .  BOTOX 100 units SOLR injection, , Disp: , Rfl:  .  Cholecalciferol (VITAMIN D3) 5000 units TABS, Take 1 capsule by mouth once., Disp: , Rfl:  .  EPINEPHrine 0.3 mg/0.3 mL IJ SOAJ injection, AS DIRECTED INJECTION 30, Disp: , Rfl:  .  fluticasone-salmeterol (ADVAIR) 250-50 MCG/ACT AEPB, Inhale 1 puff into the lungs 2 (two) times daily., Disp: , Rfl:  .  Galcanezumab-gnlm 120 MG/ML SOAJ, Inject into the skin. One injection monthly, Disp: , Rfl:  .  imipramine (TOFRANIL) 25 MG tablet, Take 25 mg by mouth daily., Disp: , Rfl:  .  levocetirizine (XYZAL) 5 MG tablet, Take 5 mg by mouth every evening., Disp: , Rfl:  .  Magnesium 250 MG TABS, Take by mouth. 1 tablet daily, Disp: , Rfl:  .  Multiple Vitamin (MULTIVITAMIN) tablet, Take 1 tablet by mouth daily., Disp: , Rfl:  .  omeprazole (PRILOSEC) 20 MG capsule, Take 20 mg by mouth daily as needed., Disp: , Rfl:  .  POTASSIUM CHLORIDE PO, Take 1 tablet by mouth once.  550 mg daily, Disp: , Rfl:  .  PULMICORT FLEXHALER 180 MCG/ACT inhaler, Inhale 2 puffs into the lungs 2 (two) times daily., Disp: , Rfl:  .  TURMERIC CURCUMIN PO, Take by mouth daily., Disp: , Rfl:  .  vitamin E 1000 UNIT capsule, Take 400 Units by mouth daily. , Disp: , Rfl:    No Known Allergies   Review of Systems  Constitutional: Negative.   Respiratory: Negative.   Cardiovascular: Negative.   Gastrointestinal: Negative.   Psychiatric/Behavioral: Negative.   All other systems reviewed and are negative.    Today's Vitals   05/05/20 0933  BP: 112/74  Pulse: 60  Temp: 98.2 F (36.8 C)  TempSrc: Oral  Weight: 181 lb 6.4 oz (82.3 kg)  Height: 5' 9" (1.753 m)   Body mass index is 26.79 kg/m.  Wt Readings from Last 3 Encounters:  05/05/20 181 lb 6.4 oz (82.3 kg)  11/05/19 175 lb (79.4  kg)  09/25/19 173 lb (78.5 kg)   Objective:  Physical Exam Vitals and nursing note reviewed.  Constitutional:      Appearance: Normal appearance.  HENT:     Head: Normocephalic and atraumatic.     Nose:     Comments: Masked     Mouth/Throat:     Comments: Masked  Cardiovascular:     Rate and Rhythm: Normal rate and regular rhythm.     Heart sounds: Normal heart sounds.  Pulmonary:     Effort: Pulmonary effort is normal.     Breath sounds: Normal breath sounds.  Musculoskeletal:     Cervical back: Normal range of motion.  Skin:    General: Skin is warm.  Neurological:     General: No focal deficit present.     Mental Status: She is alert.  Psychiatric:        Mood and Affect: Mood normal.        Behavior: Behavior normal.         Assessment And Plan:     1. Female climacteric state Comments: I will call in new rx prog/dhea 100/7.60m DHEA.  She would like to have a 90 day supply called in. I anticipate to further decrease progesterone next time. She is encouraged to touch base with me in 2 weeks to let me know how she is feeling on her new formulation. She will f/u in 3-4 months for re-evaluation.  2. Fatigue, unspecified type Comments: I will check labs as listed below. She is encouraged to stay well hydrated.  - TSH - CMP14+EGFR - CBC no Diff - Thyroid Peroxidase Antibody - Vitamin B12  3. Other nonthrombocytopenic purpura ([place] Comments: Diagnosed as per Derm. Dec 2021 Derm notes reviewed in full detail.      Patient was given opportunity to ask questions. Patient verbalized understanding of the plan and was able to repeat key elements of the plan. All questions were answered to their satisfaction.    I, RMaximino Greenland MD, have reviewed all documentation for this visit. The documentation on 05/05/20 for the exam, diagnosis, procedures, and orders are all accurate and complete.   IF YOU HAVE BEEN REFERRED TO A SPECIALIST, IT MAY TAKE 1-2 WEEKS TO  SCHEDULE/PROCESS THE REFERRAL. IF YOU HAVE NOT HEARD FROM US/SPECIALIST IN TWO WEEKS, PLEASE GIVE [place] CALL AT [phone].   THE PATIENT IS ENCOURAGED TO PRACTICE SOCIAL DISTANCING DUE TO THE COVID-19 PANDEMIC.

## 2020-05-06 LAB — CBC
Hematocrit: 33 % — ABNORMAL LOW (ref 34.0–46.6)
Hemoglobin: 11.1 g/dL (ref 11.1–15.9)
MCH: 31.3 pg (ref 26.6–33.0)
MCHC: 33.6 g/dL (ref 31.5–35.7)
MCV: 93 fL (ref 79–97)
Platelets: 488 10*3/uL — ABNORMAL HIGH (ref 150–450)
RBC: 3.55 x10E6/uL — ABNORMAL LOW (ref 3.77–5.28)
RDW: 12.7 % (ref 11.7–15.4)
WBC: 7.2 10*3/uL (ref 3.4–10.8)

## 2020-05-06 LAB — CMP14+EGFR
ALT: 19 IU/L (ref 0–32)
AST: 18 IU/L (ref 0–40)
Albumin/Globulin Ratio: 1.9 (ref 1.2–2.2)
Albumin: 4 g/dL (ref 3.8–4.9)
Alkaline Phosphatase: 73 IU/L (ref 44–121)
BUN/Creatinine Ratio: 18 (ref 12–28)
BUN: 12 mg/dL (ref 8–27)
Bilirubin Total: <0.2 mg/dL (ref 0.0–1.2)
CO2: 24 mmol/L (ref 20–29)
Calcium: 9.3 mg/dL (ref 8.7–10.3)
Chloride: 101 mmol/L (ref 96–106)
Creatinine, Ser: 0.65 mg/dL (ref 0.57–1.00)
Globulin, Total: 2.1 g/dL (ref 1.5–4.5)
Glucose: 96 mg/dL (ref 65–99)
Potassium: 4.5 mmol/L (ref 3.5–5.2)
Sodium: 140 mmol/L (ref 134–144)
Total Protein: 6.1 g/dL (ref 6.0–8.5)
eGFR: 101 mL/min/{1.73_m2} (ref >59)

## 2020-05-06 LAB — THYROID PEROXIDASE ANTIBODY: Thyroperoxidase Ab SerPl-aCnc: <8 IU/mL (ref 0–34)

## 2020-05-06 LAB — VITAMIN B12: Vitamin B-12: 776 pg/mL (ref 232–1245)

## 2020-05-06 LAB — TSH: TSH: 2.46 u[IU]/mL (ref 0.450–4.500)

## 2020-05-09 LAB — IRON AND TIBC
Iron Saturation: 17 % (ref 15–55)
Iron: 50 ug/dL (ref 27–159)
Total Iron Binding Capacity: 302 ug/dL (ref 250–450)
UIBC: 252 ug/dL (ref 131–425)

## 2020-05-09 LAB — FERRITIN: Ferritin: 70 ng/mL (ref 15–150)

## 2020-05-09 LAB — SPECIMEN STATUS REPORT

## 2020-06-29 ENCOUNTER — Ambulatory Visit (INDEPENDENT_AMBULATORY_CARE_PROVIDER_SITE_OTHER): Payer: 59 | Admitting: Dermatology

## 2020-06-29 ENCOUNTER — Other Ambulatory Visit: Payer: Self-pay

## 2020-06-29 DIAGNOSIS — Z85828 Personal history of other malignant neoplasm of skin: Secondary | ICD-10-CM

## 2020-06-29 DIAGNOSIS — L814 Other melanin hyperpigmentation: Secondary | ICD-10-CM

## 2020-06-29 DIAGNOSIS — Z1283 Encounter for screening for malignant neoplasm of skin: Secondary | ICD-10-CM

## 2020-06-29 DIAGNOSIS — L304 Erythema intertrigo: Secondary | ICD-10-CM

## 2020-06-29 DIAGNOSIS — B009 Herpesviral infection, unspecified: Secondary | ICD-10-CM

## 2020-06-29 DIAGNOSIS — D1800 Hemangioma unspecified site: Secondary | ICD-10-CM

## 2020-06-29 DIAGNOSIS — Z86018 Personal history of other benign neoplasm: Secondary | ICD-10-CM | POA: Diagnosis not present

## 2020-06-29 DIAGNOSIS — L578 Other skin changes due to chronic exposure to nonionizing radiation: Secondary | ICD-10-CM

## 2020-06-29 DIAGNOSIS — L821 Other seborrheic keratosis: Secondary | ICD-10-CM

## 2020-06-29 DIAGNOSIS — D229 Melanocytic nevi, unspecified: Secondary | ICD-10-CM

## 2020-06-29 DIAGNOSIS — L719 Rosacea, unspecified: Secondary | ICD-10-CM

## 2020-06-29 MED ORDER — VALACYCLOVIR HCL 500 MG PO TABS: 500.0000 mg | ORAL_TABLET | Freq: Two times a day (BID) | ORAL | 11 refills | Status: DC

## 2020-06-29 NOTE — Patient Instructions (Signed)

## 2020-06-29 NOTE — Progress Notes (Signed)
Follow-Up Visit   Subjective  Michelle Mccarthy is a 60 y.o. female who presents for the following: Annual Exam (Mole check ). Hx of BCC, Hx of Dysplastic nevus. The patient presents for Total-Body Skin Exam (TBSE) for skin cancer screening and mole check.   The following portions of the chart were reviewed this encounter and updated as appropriate:   Tobacco  Allergies  Meds  Problems  Med Hx  Surg Hx  Fam Hx      Review of Systems:  No other skin or systemic complaints except as noted in HPI or Assessment and Plan.  Objective  Well appearing patient in no apparent distress; mood and affect are within normal limits.  A full examination was performed including scalp, head, eyes, ears, nose, lips, neck, chest, axillae, abdomen, back, buttocks, bilateral upper extremities, bilateral lower extremities, hands, feet, fingers, toes, fingernails, and toenails. All findings within normal limits unless otherwise noted below.  Inframammary Erythema and maceration    buttock Clear today   nose Mid face erythema with telangiectasias +/- scattered inflammatory papules.    Assessment & Plan  Erythema intertrigo Inframammary  Intertrigo is a chronic recurrent rash that occurs in skin fold areas that may be associated with friction; heat; moisture; yeast; fungus; and bacteria.  It is exacerbated by increased movement / activity; sweating; and higher atmospheric temperature.   Cont Skin medicinals intertrigo cream Iodoquinol: 1% Hydrocortisone: 2.5% Niacinamide: 2% Vehicle: Cream  Herpes simplex buttock  Herpes Simplex Virus = Cold Sores = Fever Blisters is a chronic recurring blistering; scabbing sore-producing viral infection that is recurrent usually in the same area triggered by stress, sun/UV exposure and trauma.  It is infectious and can be spread from person to person by direct contact.  It is not curable, but is treatable with topical and oral medication.  Cont Valtrex 500  mg tablets  Take 1 tablet twice a day x 7 days prn flares    Related Medications valACYclovir (VALTREX) 500 MG tablet Take 1 tablet (500 mg total) by mouth 2 (two) times daily. Take 1 tablet twice a day x 7 days prn flares  Rosacea nose  Rosacea is a chronic progressive skin condition usually affecting the face of adults, causing redness and/or acne bumps. It is treatable but not curable. It sometimes affects the eyes (ocular rosacea) as well. It may respond to topical and/or systemic medication and can flare with stress, sun exposure, alcohol, exercise and some foods.  Daily application of broad spectrum spf 30+ sunscreen to face is recommended to reduce flares.   Cont skin medicinals Rosacea triple cream  Azelaic Acid: 15% Ivermectin: 1% Metronidazole: 1% Vehicle: Cream   Skin cancer screening  Lentigines - Scattered tan macules - Due to sun exposure - Benign-appering, observe - Recommend daily broad spectrum sunscreen SPF 30+ to sun-exposed areas, reapply every 2 hours as needed. - Call for any changes  Seborrheic Keratoses - Stuck-on, waxy, tan-brown papules and/or plaques  - Benign-appearing - Discussed benign etiology and prognosis. - Observe - Call for any changes  Melanocytic Nevi - Tan-brown and/or pink-flesh-colored symmetric macules and papules - Benign appearing on exam today - Observation - Call clinic for new or changing moles - Recommend daily use of broad spectrum spf 30+ sunscreen to sun-exposed areas.   Hemangiomas - Red papules - Discussed benign nature - Observe - Call for any changes  Actinic Damage - Chronic condition, secondary to cumulative UV/sun exposure - diffuse scaly erythematous macules with  underlying dyspigmentation - Recommend daily broad spectrum sunscreen SPF 30+ to sun-exposed areas, reapply every 2 hours as needed.  - Staying in the shade or wearing long sleeves, sun glasses (UVA+UVB protection) and wide brim hats (4-inch brim  around the entire circumference of the hat) are also recommended for sun protection.  - Call for new or changing lesions.  History of Dysplastic Nevi Multiple sites see history  - No evidence of recurrence today - Recommend regular full body skin exams - Recommend daily broad spectrum sunscreen SPF 30+ to sun-exposed areas, reapply every 2 hours as needed.  - Call if any new or changing lesions are noted between office visits   History of Basal Cell Carcinoma of the Skin Left deltoid  - No evidence of recurrence today - Recommend regular full body skin exams - Recommend daily broad spectrum sunscreen SPF 30+ to sun-exposed areas, reapply every 2 hours as needed.  - Call if any new or changing lesions are noted between office visits   Skin cancer screening performed today.   Return in about 1 year (around 06/29/2021) for TBSE, Hx of BCC, Hx of Dysplastic nevus .  IMarye Round, CMA, am acting as scribe for Sarina Ser, MD .  Documentation: I have reviewed the above documentation for accuracy and completeness, and I agree with the above.  Sarina Ser, MD

## 2020-07-05 ENCOUNTER — Encounter: Payer: Self-pay | Admitting: Dermatology

## 2020-07-29 ENCOUNTER — Ambulatory Visit
Admission: RE | Admit: 2020-07-29 | Discharge: 2020-07-29 | Disposition: A | Discharge status: Home / Self Care | Payer: 59 | Source: Ambulatory Visit | Attending: Allergy and Immunology | Admitting: Allergy and Immunology

## 2020-07-29 ENCOUNTER — Other Ambulatory Visit: Payer: Self-pay | Admitting: Allergy and Immunology

## 2020-07-29 DIAGNOSIS — J4530 Mild persistent asthma, uncomplicated: Secondary | ICD-10-CM

## 2020-09-13 ENCOUNTER — Ambulatory Visit: Payer: 59 | Admitting: Internal Medicine

## 2020-09-20 ENCOUNTER — Other Ambulatory Visit: Payer: Self-pay

## 2020-09-20 ENCOUNTER — Encounter: Payer: Self-pay | Admitting: Internal Medicine

## 2020-09-20 ENCOUNTER — Ambulatory Visit (INDEPENDENT_AMBULATORY_CARE_PROVIDER_SITE_OTHER): Payer: 59 | Admitting: Internal Medicine

## 2020-09-20 VITALS — BP 126/74 | HR 90 | Temp 98.7°F | Ht 69.0 in | Wt 183.8 lb

## 2020-09-20 DIAGNOSIS — R0683 Snoring: Secondary | ICD-10-CM

## 2020-09-20 DIAGNOSIS — N951 Menopausal and female climacteric states: Secondary | ICD-10-CM | POA: Diagnosis not present

## 2020-09-20 DIAGNOSIS — R5382 Chronic fatigue, unspecified: Secondary | ICD-10-CM

## 2020-09-20 DIAGNOSIS — Z23 Encounter for immunization: Secondary | ICD-10-CM | POA: Diagnosis not present

## 2020-09-20 LAB — CMP14+EGFR
ALT: 18 IU/L (ref 0–32)
AST: 21 IU/L (ref 0–40)
Albumin/Globulin Ratio: 2.2 (ref 1.2–2.2)
Albumin: 4.6 g/dL (ref 3.8–4.8)
Alkaline Phosphatase: 79 IU/L (ref 44–121)
BUN/Creatinine Ratio: 20 (ref 12–28)
BUN: 14 mg/dL (ref 8–27)
Bilirubin Total: 0.2 mg/dL (ref 0.0–1.2)
CO2: 25 mmol/L (ref 20–29)
Calcium: 9.4 mg/dL (ref 8.7–10.3)
Chloride: 101 mmol/L (ref 96–106)
Creatinine, Ser: 0.7 mg/dL (ref 0.57–1.00)
Globulin, Total: 2.1 g/dL (ref 1.5–4.5)
Glucose: 106 mg/dL — ABNORMAL HIGH (ref 65–99)
Potassium: 4.7 mmol/L (ref 3.5–5.2)
Sodium: 142 mmol/L (ref 134–144)
Total Protein: 6.7 g/dL (ref 6.0–8.5)
eGFR: 98 mL/min/{1.73_m2} (ref >59)

## 2020-09-20 LAB — CBC
Hematocrit: 40.7 % (ref 34.0–46.6)
Hemoglobin: 13.8 g/dL (ref 11.1–15.9)
MCH: 30.5 pg (ref 26.6–33.0)
MCHC: 33.9 g/dL (ref 31.5–35.7)
MCV: 90 fL (ref 79–97)
Platelets: 314 10*3/uL (ref 150–450)
RBC: 4.52 x10E6/uL (ref 3.77–5.28)
RDW: 13.3 % (ref 11.7–15.4)
WBC: 6.3 10*3/uL (ref 3.4–10.8)

## 2020-09-20 MED ORDER — SHINGRIX 50 MCG/0.5ML IM SUSR: 0.5000 mL | Freq: Once | INTRAMUSCULAR | 0 refills | Status: AC

## 2020-09-20 NOTE — Progress Notes (Signed)
I,Katawbba Wiggins,acting as a Education administrator for [name], MD.,have documented all relevant documentation on the behalf of [name], MD,as directed by  [name], MD while in the presence of [name], MD.  This visit occurred during the SARS-CoV-2 public health emergency.  Safety protocols were in place, including screening questions prior to the visit, additional usage of staff PPE, and extensive cleaning of exam room while observing appropriate contact time as indicated for disinfecting solutions.  Subjective:     Patient ID: Michelle Mccarthy , female    DOB: 01-Mar-1960 , 60 y.o.   MRN: [id number]   Chief Complaint  Patient presents with   Hormones f/u    HPI  She presents today for f/u on her hormones.  She is currently taking progesterone/DHEA 100/10 nightly. She has not had any issues with BHRT. She still c/o chronic fatigue.     Past Medical History:  Diagnosis Date   Arthritis    back   Asthma    Atypical mole 11/27/2017   L mid side/mod   Atypical mole 12/18/2018   L mid to upper back/mod, L lateral buttock/mod   Family history of ovarian cancer    9/20 genetic testing letter sent   Hx of basal cell carcinoma 09/18/2016   multiple sites    Hx of dysplastic nevus 06/11/2017   L ant waistline lateral/excision   Hx of migraines    Insomnia    Menopausal and female climacteric states    Migraine headache    2-3/week   Motion sickness    cars - back seat, circular motion   Postmenopausal atrophic vaginitis      Family History  Problem Relation Age of Onset   Dementia Mother    Colon cancer Father    Ovarian cancer Maternal Aunt 60   Breast cancer Neg Hx      Current Outpatient Medications:    albuterol (PROVENTIL HFA;VENTOLIN HFA) 108 (90 Base) MCG/ACT inhaler, , Disp: , Rfl:    azelastine (ASTELIN) 0.1 % nasal spray, USE 1 SPRAY IN EACH NOSTRIL TWICE A DAY NASALLY 30 DAY, Disp: , Rfl: 5   b complex vitamins capsule, Take 1 capsule by mouth  daily., Disp: , Rfl:    BOTOX 100 units SOLR injection, every 3 (three) months. For migraines, Disp: , Rfl:    Cholecalciferol (VITAMIN D3) 5000 units TABS, Take 1 capsule by mouth once. 2,500 units daily, Disp: , Rfl:    EPINEPHrine 0.3 mg/0.3 mL IJ SOAJ injection, AS DIRECTED INJECTION 30, Disp: , Rfl:    Fluticasone-Umeclidin-Vilant (TRELEGY ELLIPTA) 200-62.5-25 MCG/INH AEPB, 1 puff, Disp: , Rfl:    Galcanezumab-gnlm 120 MG/ML SOAJ, Inject into the skin. One injection monthly, Disp: , Rfl:    imipramine (TOFRANIL) 25 MG tablet, Take 25 mg by mouth daily., Disp: , Rfl:    levocetirizine (XYZAL) 5 MG tablet, Take 5 mg by mouth every evening., Disp: , Rfl:    Magnesium 250 MG TABS, Take by mouth. 1 tablet daily, Disp: , Rfl:    Multiple Vitamin (MULTIVITAMIN) tablet, Take 1 tablet by mouth daily., Disp: , Rfl:    omeprazole (PRILOSEC) 20 MG capsule, Take 20 mg by mouth daily as needed., Disp: , Rfl:    POTASSIUM CHLORIDE PO, Take 1 tablet by mouth once. 550 mg daily, Disp: , Rfl:    TURMERIC CURCUMIN PO, Take by mouth daily., Disp: , Rfl:    valACYclovir (VALTREX) 500 MG tablet, Take 1 tablet (  500 mg total) by mouth 2 (two) times daily. Take 1 tablet twice a day x 7 days prn flares, Disp: 30 tablet, Rfl: 11   vitamin E 1000 UNIT capsule, Take 400 Units by mouth daily. , Disp: , Rfl:    No Known Allergies   Review of Systems  Constitutional:  Positive for fatigue.  Respiratory: Negative.    Cardiovascular: Negative.   Gastrointestinal: Negative.   Neurological: Negative.   Psychiatric/Behavioral: Negative.    All other systems reviewed and are negative.   Today's Vitals   09/20/20 1032  BP: 126/74  Pulse: 90  Temp: 98.7 F (37.1 C)  TempSrc: Oral  Weight: 183 lb 12.8 oz (83.4 kg)  Height: '5\' 9"'  (1.753 m)   Body mass index is 27.14 kg/m.  Wt Readings from Last 3 Encounters:  09/20/20 183 lb 12.8 oz (83.4 kg)  05/05/20 181 lb 6.4 oz (82.3 kg)  11/05/19 175 lb (79.4 kg)    BP  Readings from Last 3 Encounters:  09/20/20 126/74  05/05/20 112/74  11/05/19 136/88    Objective:  Physical Exam Vitals and nursing note reviewed.  Constitutional:      Appearance: Normal appearance.  HENT:     Head: Normocephalic and atraumatic.     Nose:     Comments: Masked     Mouth/Throat:     Comments: Masked  Eyes:     Extraocular Movements: Extraocular movements intact.  Cardiovascular:     Rate and Rhythm: Normal rate and regular rhythm.     Heart sounds: Normal heart sounds.  Pulmonary:     Effort: Pulmonary effort is normal.     Breath sounds: Normal breath sounds.  Skin:    General: Skin is warm.  Neurological:     General: No focal deficit present.     Mental Status: She is alert.  Psychiatric:        Mood and Affect: Mood normal.        Behavior: Behavior normal.        Assessment And Plan:     1. Female climacteric state Comments: Chronic, she will c/w progesterone/DHEA daily.She will f/u in 4 months for re-evaluation.   2. Chronic fatigue Comments: We discussed possibility of OSA, she declines sleep study. Encouraged to stay well hydrated and c/w Mg supplementation.  - CBC no Diff - CMP14+EGFR  3. Snoring Comments: She declines Neuro evaluation for sleep study.   4. Immunization due Comments: She declined flu vaccine today. I will send Shingrix vaccine to her local pharmacy.     Patient was given opportunity to ask questions. Patient verbalized understanding of the plan and was able to repeat key elements of the plan. All questions were answered to their satisfaction.   I, [name], MD, have reviewed all documentation for this visit. The documentation on 09/20/20 for the exam, diagnosis, procedures, and orders are all accurate and complete.   IF YOU HAVE BEEN REFERRED TO A SPECIALIST, IT MAY TAKE 1-2 WEEKS TO SCHEDULE/PROCESS THE REFERRAL. IF YOU HAVE NOT HEARD FROM US/SPECIALIST IN TWO WEEKS, PLEASE GIVE [place] A CALL AT [phone].    THE PATIENT IS ENCOURAGED TO PRACTICE SOCIAL DISTANCING DUE TO THE COVID-19 PANDEMIC.

## 2020-11-17 ENCOUNTER — Other Ambulatory Visit: Payer: Self-pay

## 2020-11-17 ENCOUNTER — Ambulatory Visit: Payer: 59 | Admitting: Podiatry

## 2020-11-17 ENCOUNTER — Ambulatory Visit (INDEPENDENT_AMBULATORY_CARE_PROVIDER_SITE_OTHER): Payer: 59

## 2020-11-17 ENCOUNTER — Encounter: Payer: Self-pay | Admitting: Podiatry

## 2020-11-17 DIAGNOSIS — M722 Plantar fascial fibromatosis: Secondary | ICD-10-CM

## 2020-11-17 DIAGNOSIS — M7752 Other enthesopathy of left foot: Secondary | ICD-10-CM

## 2020-11-17 DIAGNOSIS — M7751 Other enthesopathy of right foot: Secondary | ICD-10-CM | POA: Diagnosis not present

## 2020-11-17 NOTE — Progress Notes (Signed)
Subjective:  Patient ID: Michelle Mccarthy, female    DOB: 07/15/1960,  MRN: [id number]  No chief complaint on file.   60 y.o. female presents with the above complaint.  Patient presents with complaint left ankle pain that has been causing her a lot of pain.  She states it hurts with putting ambulation and putting weight on it.  She states that she had a recent ankle injury where she twisted it very softly while getting ready go on her motorcycle.  She states is been painful ever since has progressed to gotten worse.  She also has another complaint of right heel pain that been going for quite a time and may be compensatory and hurting a lot.  She does have history of Planter fasciitis.  She has not seen anyone as prior to seeing me.  She would like to discuss treatment options for this.   Review of Systems: Negative except as noted in the HPI. Denies N/V/F/Ch.  Past Medical History:  Diagnosis Date   Arthritis    back   Asthma    Atypical mole 11/27/2017   L mid side/mod   Atypical mole 12/18/2018   L mid to upper back/mod, L lateral buttock/mod   Family history of ovarian cancer    9/20 genetic testing letter sent   Hx of basal cell carcinoma 09/18/2016   multiple sites    Hx of dysplastic nevus 06/11/2017   L ant waistline lateral/excision   Hx of migraines    Insomnia    Menopausal and female climacteric states    Migraine headache    2-3/week   Motion sickness    cars - back seat, circular motion   Postmenopausal atrophic vaginitis     Current Outpatient Medications:    albuterol (PROVENTIL HFA;VENTOLIN HFA) 108 (90 Base) MCG/ACT inhaler, , Disp: , Rfl:    azelastine (ASTELIN) 0.1 % nasal spray, USE 1 SPRAY IN EACH NOSTRIL TWICE A DAY NASALLY 30 DAY, Disp: , Rfl: 5   b complex vitamins capsule, Take 1 capsule by mouth daily., Disp: , Rfl:    BOTOX 100 units SOLR injection, every 3 (three) months. For migraines, Disp: , Rfl:    Cholecalciferol (VITAMIN D3) 5000 units TABS,  Take 1 capsule by mouth once. 2,500 units daily, Disp: , Rfl:    EPINEPHrine 0.3 mg/0.3 mL IJ SOAJ injection, AS DIRECTED INJECTION 30, Disp: , Rfl:    Fluticasone-Umeclidin-Vilant (TRELEGY ELLIPTA) 200-62.5-25 MCG/INH AEPB, 1 puff, Disp: , Rfl:    Galcanezumab-gnlm 120 MG/ML SOAJ, Inject into the skin. One injection monthly, Disp: , Rfl:    imipramine (TOFRANIL) 25 MG tablet, Take 25 mg by mouth daily., Disp: , Rfl:    levocetirizine (XYZAL) 5 MG tablet, Take 5 mg by mouth every evening., Disp: , Rfl:    Magnesium 250 MG TABS, Take by mouth. 1 tablet daily, Disp: , Rfl:    Multiple Vitamin (MULTIVITAMIN) tablet, Take 1 tablet by mouth daily., Disp: , Rfl:    omeprazole (PRILOSEC) 20 MG capsule, Take 20 mg by mouth daily as needed., Disp: , Rfl:    POTASSIUM CHLORIDE PO, Take 1 tablet by mouth once. 550 mg daily, Disp: , Rfl:    TURMERIC CURCUMIN PO, Take by mouth daily., Disp: , Rfl:    valACYclovir (VALTREX) 500 MG tablet, Take 1 tablet (500 mg total) by mouth 2 (two) times daily. Take 1 tablet twice a day x 7 days prn flares, Disp: 30 tablet, Rfl: 11   vitamin  E 1000 UNIT capsule, Take 400 Units by mouth daily. , Disp: , Rfl:   Social History   Tobacco Use  Smoking Status Never  Smokeless Tobacco Never    No Known Allergies Objective:  There were no vitals filed for this visit. There is no height or weight on file to calculate BMI. Constitutional Well developed. Well nourished.  Vascular Dorsalis pedis pulses palpable bilaterally. Posterior tibial pulses palpable bilaterally. Capillary refill normal to all digits.  No cyanosis or clubbing noted. Pedal hair growth normal.  Neurologic Normal speech. Oriented to person, place, and time. Epicritic sensation to light touch grossly present bilaterally.  Dermatologic Nails well groomed and normal in appearance. No open wounds. No skin lesions.  Orthopedic: .  No palpation to the left ankle pain circumferential around the ankle joint  as well as ATFL ligament.  Mild pain with plantarflexion inversion of the foot.  No pain with dorsiflexion eversion of the foot.  No pain with Achilles tendon, peroneal tendon, posterior tibial palpation. Pain on palpation of the right heel  Tender to palpation at the calcaneal tuber right. No pain with calcaneal squeeze right. Ankle ROM diminished range of motion right. Silfverskiold Test: positive right.    Radiographs: Bilateral midfoot arthritis noted.  Heel spurring noted bilaterally.  No other bony abnormalities identified.  No stress fracture noted no actual fracture noted. Assessment:   1. Capsulitis of ankle, left   2. Plantar fasciitis of right foot    Plan:  Patient was evaluated and treated and all questions answered.  Left ankle sprain/ankle capsulitis -I explained the patient the etiology ankle sprain extracapsular emergency room and options were discussed.  Given the amount of pain that she is having I believe she would benefit from cam boot immobilization to allow the generalized tenderness to be more focal.  If there is no improvement we will discuss options for injection.  She states understanding. Plantar Fasciitis, right - XR reviewed as above.  - Educated on icing and stretching. Instructions given.  - Injection delivered to the plantar fascia as below. - DME: Plantar Fascial Brace - Pharmacologic management: None  Procedure: Injection Tendon/Ligament Location: Right plantar fascia at the glabrous junction; medial approach. Skin Prep: alcohol Injectate: 0.5 cc 0.5% marcaine plain, 0.5 cc of 1% Lidocaine, 0.5 cc kenalog 10. Disposition: Patient tolerated procedure well. Injection site dressed with a band-aid.  No follow-ups on file.    No follow-ups on file.

## 2020-12-15 ENCOUNTER — Ambulatory Visit (INDEPENDENT_AMBULATORY_CARE_PROVIDER_SITE_OTHER): Payer: 59 | Admitting: Podiatry

## 2020-12-15 ENCOUNTER — Other Ambulatory Visit: Payer: Self-pay

## 2020-12-15 DIAGNOSIS — M949 Disorder of cartilage, unspecified: Secondary | ICD-10-CM

## 2020-12-15 DIAGNOSIS — M7752 Other enthesopathy of left foot: Secondary | ICD-10-CM | POA: Diagnosis not present

## 2020-12-15 DIAGNOSIS — M722 Plantar fascial fibromatosis: Secondary | ICD-10-CM | POA: Diagnosis not present

## 2020-12-15 DIAGNOSIS — T148XXA Other injury of unspecified body region, initial encounter: Secondary | ICD-10-CM | POA: Diagnosis not present

## 2020-12-15 DIAGNOSIS — M899 Disorder of bone, unspecified: Secondary | ICD-10-CM

## 2020-12-15 NOTE — Progress Notes (Signed)
Subjective:  Patient ID: Michelle Mccarthy, female    DOB: 1960/11/12,  MRN: [id number]  Chief Complaint  Patient presents with   Foot Pain    Left foot pain      60 y.o. female presents with the above complaint.  Patient presents with follow-up to left ankle lateral pain.  Patient states is about the same the cam boot immobilization helped a little bit but she still hurting a lot to the left ankle.  She states that the right heel is doing a little bit better the injection helped the bracing did not help.  She would like to discuss treatment options for that.  She denies any other acute complaints.   Review of Systems: Negative except as noted in the HPI. Denies N/V/F/Ch.  Past Medical History:  Diagnosis Date   Arthritis    back   Asthma    Atypical mole 11/27/2017   L mid side/mod   Atypical mole 12/18/2018   L mid to upper back/mod, L lateral buttock/mod   Family history of ovarian cancer    9/20 genetic testing letter sent   Hx of basal cell carcinoma 09/18/2016   multiple sites    Hx of dysplastic nevus 06/11/2017   L ant waistline lateral/excision   Hx of migraines    Insomnia    Menopausal and female climacteric states    Migraine headache    2-3/week   Motion sickness    cars - back seat, circular motion   Postmenopausal atrophic vaginitis     Current Outpatient Medications:    albuterol (PROVENTIL HFA;VENTOLIN HFA) 108 (90 Base) MCG/ACT inhaler, , Disp: , Rfl:    azelastine (ASTELIN) 0.1 % nasal spray, USE 1 SPRAY IN EACH NOSTRIL TWICE A DAY NASALLY 30 DAY, Disp: , Rfl: 5   b complex vitamins capsule, Take 1 capsule by mouth daily., Disp: , Rfl:    BOTOX 100 units SOLR injection, every 3 (three) months. For migraines, Disp: , Rfl:    Cholecalciferol (VITAMIN D3) 5000 units TABS, Take 1 capsule by mouth once. 2,500 units daily, Disp: , Rfl:    EPINEPHrine 0.3 mg/0.3 mL IJ SOAJ injection, AS DIRECTED INJECTION 30, Disp: , Rfl:    Fluticasone-Umeclidin-Vilant  (TRELEGY ELLIPTA) 200-62.5-25 MCG/INH AEPB, 1 puff, Disp: , Rfl:    Galcanezumab-gnlm 120 MG/ML SOAJ, Inject into the skin. One injection monthly, Disp: , Rfl:    imipramine (TOFRANIL) 25 MG tablet, Take 25 mg by mouth daily., Disp: , Rfl:    levocetirizine (XYZAL) 5 MG tablet, Take 5 mg by mouth every evening., Disp: , Rfl:    Magnesium 250 MG TABS, Take by mouth. 1 tablet daily, Disp: , Rfl:    Multiple Vitamin (MULTIVITAMIN) tablet, Take 1 tablet by mouth daily., Disp: , Rfl:    omeprazole (PRILOSEC) 20 MG capsule, Take 20 mg by mouth daily as needed., Disp: , Rfl:    POTASSIUM CHLORIDE PO, Take 1 tablet by mouth once. 550 mg daily, Disp: , Rfl:    TURMERIC CURCUMIN PO, Take by mouth daily., Disp: , Rfl:    valACYclovir (VALTREX) 500 MG tablet, Take 1 tablet (500 mg total) by mouth 2 (two) times daily. Take 1 tablet twice a day x 7 days prn flares, Disp: 30 tablet, Rfl: 11   vitamin E 1000 UNIT capsule, Take 400 Units by mouth daily. , Disp: , Rfl:   Social History   Tobacco Use  Smoking Status Never  Smokeless Tobacco Never  No Known Allergies Objective:  There were no vitals filed for this visit. There is no height or weight on file to calculate BMI. Constitutional Well developed. Well nourished.  Vascular Dorsalis pedis pulses palpable bilaterally. Posterior tibial pulses palpable bilaterally. Capillary refill normal to all digits.  No cyanosis or clubbing noted. Pedal hair growth normal.  Neurologic Normal speech. Oriented to person, place, and time. Epicritic sensation to light touch grossly present bilaterally.  Dermatologic Nails well groomed and normal in appearance. No open wounds. No skin lesions.  Orthopedic: Pain palpation to the left ankle pain circumferential around the ankle joint as well as ATFL ligament.  Mild pain with plantarflexion inversion of the foot.  No pain with dorsiflexion eversion of the foot.  No pain with Achilles tendon, peroneal tendon,  posterior tibial palpation. Pain on palpation of the right heel  Tender to palpation at the calcaneal tuber right. No pain with calcaneal squeeze right. Ankle ROM diminished range of motion right. Silfverskiold Test: positive right.    Radiographs: Bilateral midfoot arthritis noted.  Heel spurring noted bilaterally.  No other bony abnormalities identified.  No stress fracture noted no actual fracture noted. Assessment:   1. Ligament tear   2. Capsulitis of ankle, left   3. Plantar fasciitis, right   4. Osteochondral lesion    Plan:  Patient was evaluated and treated and all questions answered.  Left ankle sprain/ankle capsulitis versus osteochondral lesion -I explained the patient the etiology ankle sprain extracapsular emergency room and options were discussed.  -Cam boot has decreased some of her pain and localized more to the lateral ankle.  Given the amount of pain that she is having she will benefit from steroid injection help decrease acute inflammatory component associated pain.  I also believe she would benefit from an MRI to rule out osteochondral lesion.  I discussed this findings with the patient extensive detail she would like to proceed with MRI and the steroid injection -A steroid injection was performed at left ankle using 1% plain Lidocaine and 10 mg of Kenalog. This was well tolerated. -MRI was ordered of the left ankle   Plantar Fasciitis, right - XR reviewed as above.  - Educated on icing and stretching. Instructions given.  -Second injection delivered to the plantar fascia as below. - DME: Plantar Fascial Brace - Pharmacologic management: None -Discussed over-the-counter insoles power steps.  Procedure: Injection Tendon/Ligament Location: Right plantar fascia at the glabrous junction; medial approach. Skin Prep: alcohol Injectate: 0.5 cc 0.5% marcaine plain, 0.5 cc of 1% Lidocaine, 0.5 cc kenalog 10. Disposition: Patient tolerated procedure well. Injection  site dressed with a band-aid.  No follow-ups on file.    No follow-ups on file.

## 2020-12-20 ENCOUNTER — Other Ambulatory Visit: Payer: Self-pay | Admitting: Dermatology

## 2020-12-20 DIAGNOSIS — B009 Herpesviral infection, unspecified: Secondary | ICD-10-CM

## 2021-01-05 ENCOUNTER — Ambulatory Visit
Admission: RE | Admit: 2021-01-05 | Discharge: 2021-01-05 | Disposition: A | Discharge status: Home / Self Care | Payer: 59 | Source: Ambulatory Visit | Attending: Podiatry | Admitting: Podiatry

## 2021-01-05 ENCOUNTER — Other Ambulatory Visit: Payer: Self-pay

## 2021-01-05 DIAGNOSIS — T148XXA Other injury of unspecified body region, initial encounter: Secondary | ICD-10-CM

## 2021-01-12 ENCOUNTER — Encounter: Payer: Self-pay | Admitting: Podiatry

## 2021-01-12 ENCOUNTER — Ambulatory Visit (INDEPENDENT_AMBULATORY_CARE_PROVIDER_SITE_OTHER): Payer: 59 | Admitting: Podiatry

## 2021-01-12 ENCOUNTER — Other Ambulatory Visit: Payer: Self-pay

## 2021-01-12 DIAGNOSIS — M76822 Posterior tibial tendinitis, left leg: Secondary | ICD-10-CM

## 2021-01-12 DIAGNOSIS — T148XXA Other injury of unspecified body region, initial encounter: Secondary | ICD-10-CM

## 2021-01-12 DIAGNOSIS — M722 Plantar fascial fibromatosis: Secondary | ICD-10-CM

## 2021-01-12 NOTE — Progress Notes (Signed)
Subjective:  Patient ID: Michelle Mccarthy, female    DOB: 20-Jul-1960,  MRN: [id number]  Chief Complaint  Patient presents with   Foot Pain    "It's doing so, so.  One side is better, the other is not.  The Plantar Fasciitis is doing better."     61 y.o. female presents with the above complaint.  Patient presents with follow-up to left ankle lateral pain.  Patient states is about the same the cam boot immobilization helped a little bit but she still hurting a lot to the left ankle.  She states that the right heel is doing a little bit better the injection helped the bracing did not help.  She would like to discuss treatment options for that.  She denies any other acute complaints.   Review of Systems: Negative except as noted in the HPI. Denies N/V/F/Ch.  Past Medical History:  Diagnosis Date   Arthritis    back   Asthma    Atypical mole 11/27/2017   L mid side/mod   Atypical mole 12/18/2018   L mid to upper back/mod, L lateral buttock/mod   Family history of ovarian cancer    9/20 genetic testing letter sent   Hx of basal cell carcinoma 09/18/2016   multiple sites    Hx of dysplastic nevus 06/11/2017   L ant waistline lateral/excision   Hx of migraines    Insomnia    Menopausal and female climacteric states    Migraine headache    2-3/week   Motion sickness    cars - back seat, circular motion   Postmenopausal atrophic vaginitis     Current Outpatient Medications:    albuterol (PROVENTIL HFA;VENTOLIN HFA) 108 (90 Base) MCG/ACT inhaler, , Disp: , Rfl:    azelastine (ASTELIN) 0.1 % nasal spray, USE 1 SPRAY IN EACH NOSTRIL TWICE A DAY NASALLY 30 DAY, Disp: , Rfl: 5   b complex vitamins capsule, Take 1 capsule by mouth daily., Disp: , Rfl:    BOTOX 100 units SOLR injection, every 3 (three) months. For migraines, Disp: , Rfl:    Cholecalciferol (VITAMIN D3) 5000 units TABS, Take 1 capsule by mouth once. 2,500 units daily, Disp: , Rfl:    EPINEPHrine 0.3 mg/0.3 mL IJ SOAJ  injection, AS DIRECTED INJECTION 30, Disp: , Rfl:    Fluticasone-Umeclidin-Vilant (TRELEGY ELLIPTA) 200-62.5-25 MCG/INH AEPB, 1 puff, Disp: , Rfl:    Galcanezumab-gnlm 120 MG/ML SOAJ, Inject into the skin. One injection monthly, Disp: , Rfl:    imipramine (TOFRANIL) 25 MG tablet, Take 25 mg by mouth daily., Disp: , Rfl:    levocetirizine (XYZAL) 5 MG tablet, Take 5 mg by mouth every evening., Disp: , Rfl:    Magnesium 250 MG TABS, Take by mouth. 1 tablet daily, Disp: , Rfl:    Multiple Vitamin (MULTIVITAMIN) tablet, Take 1 tablet by mouth daily., Disp: , Rfl:    omeprazole (PRILOSEC) 20 MG capsule, Take 20 mg by mouth daily as needed., Disp: , Rfl:    POTASSIUM CHLORIDE PO, Take 1 tablet by mouth once. 550 mg daily, Disp: , Rfl:    TURMERIC CURCUMIN PO, Take by mouth daily., Disp: , Rfl:    valACYclovir (VALTREX) 500 MG tablet, TAKE 1 TABLET (500 MG TOTAL) BY MOUTH 2 (TWO) TIMES DAILY FOR 7 DAYS. AS NEEDED FOR FLARES, Disp: 180 tablet, Rfl: 1   vitamin E 1000 UNIT capsule, Take 400 Units by mouth daily. , Disp: , Rfl:   Social History   Tobacco  Use  Smoking Status Never  Smokeless Tobacco Never    No Known Allergies Objective:  There were no vitals filed for this visit. There is no height or weight on file to calculate BMI. Constitutional Well developed. Well nourished.  Vascular Dorsalis pedis pulses palpable bilaterally. Posterior tibial pulses palpable bilaterally. Capillary refill normal to all digits.  No cyanosis or clubbing noted. Pedal hair growth normal.  Neurologic Normal speech. Oriented to person, place, and time. Epicritic sensation to light touch grossly present bilaterally.  Dermatologic Nails well groomed and normal in appearance. No open wounds. No skin lesions.  Orthopedic: No further pain palpation to the left ankle pain circumferential around the ankle joint as well as ATFL ligament.  Mild pain with plantarflexion inversion of the foot.  No pain with  dorsiflexion eversion of the foot.  No pain with Achilles tendon, peroneal tendon, posterior tibial palpation. Pain on palpation of the right heel  Pain on palpation to the left posterior tibial tendon.  No pain at the insertion of the posterior tibia.  Pain along the course of the posterior tibia.  Pain with resisted inversion plantarflexion of the foot.  Tender to palpation at the calcaneal tuber right. No pain with calcaneal squeeze right. Ankle ROM diminished range of motion right. Silfverskiold Test: positive right.    Radiographs: Bilateral midfoot arthritis noted.  Heel spurring noted bilaterally.  No other bony abnormalities identified.  No stress fracture noted no actual fracture noted.  1. Moderate tenosynovitis of the posterior tibial tendon and flexor digitorum tendon above and below the medial malleolus. 2. Partial tear of the anterior tibiofibular ligament. 3. Very mild tenosynovitis of the inframalleolar peroneal tendons. No acute tendon tear. 4. No acute osseous abnormality.   Assessment:   1. Ligament tear   2. Plantar fasciitis, right   3. Posterior tibial tendinitis, left     Plan:  Patient was evaluated and treated and all questions answered.  Posterior tibial tendinitis left -I explained patient etiology of posterior tibial tendinitis versus treatment options were discussed.  Over the last few weeks pain has started transferring more to the medial side where she is having small swelling around the posterior tibial tendon.  She would like to discuss treatment options for this.  She is unable to wear further cam boot as it is messing with her back.  I believe she will benefit from a steroid injection of decrease acute inflammatory component associated pain.  I discussed with the patient that there is a risk of rupture associated with it.  She states understand like to proceed with injection. -A steroid injection was performed at right medial foot at point of maximal  tenderness using 1% plain Lidocaine and 10 mg of Kenalog. This was well tolerated. -Tri-Lock ankle brace was dispensed   Left ankle sprain/ankle capsulitis versus osteochondral lesion -I explained the patient the etiology ankle sprain extracapsular emergency room and options were discussed.  -Clinically the cam boot immobilization and steroid injection helped considerably.  She no longer has pain on the lateral side of her foot.  MRI was reviewed with the patient and discussed which shows partial tear of the ATFL ligament.   Plantar Fasciitis, right - XR reviewed as above.  - Educated on icing and stretching. Instructions given.  -Thyroid injection delivered to the plantar fascia as below. - DME: Plantar Fascial Brace - Pharmacologic management: None -She has obtain power steps and [place] shoes which seems to help her considerably -If there is no improvement  I have asked her to transition into CAM boot to the right side.  Procedure: Injection Tendon/Ligament Location: Right plantar fascia at the glabrous junction; medial approach. Skin Prep: alcohol Injectate: 0.5 cc 0.5% marcaine plain, 0.5 cc of 1% Lidocaine, 0.5 cc kenalog 10. Disposition: Patient tolerated procedure well. Injection site dressed with a band-aid.  No follow-ups on file.    No follow-ups on file.

## 2021-01-25 ENCOUNTER — Telehealth: Payer: Self-pay

## 2021-01-25 NOTE — Telephone Encounter (Signed)
Patient called and left voicemail she has had no improvement with skin medicinals intertrigo cream for the Inframammary area.

## 2021-01-26 NOTE — Telephone Encounter (Signed)
Patient advised of information per Dr. Nehemiah Massed. She states the cream helped when she first started using but she now has a flare she can not get to improve. Patient scheduled to see Dr. Nehemiah Massed next week. aw

## 2021-02-02 ENCOUNTER — Other Ambulatory Visit: Payer: Self-pay

## 2021-02-02 ENCOUNTER — Ambulatory Visit: Payer: 59 | Admitting: Dermatology

## 2021-02-02 DIAGNOSIS — L304 Erythema intertrigo: Secondary | ICD-10-CM | POA: Diagnosis not present

## 2021-02-02 DIAGNOSIS — L820 Inflamed seborrheic keratosis: Secondary | ICD-10-CM

## 2021-02-02 DIAGNOSIS — L821 Other seborrheic keratosis: Secondary | ICD-10-CM

## 2021-02-02 MED ORDER — FLUCONAZOLE 200 MG PO TABS: ORAL_TABLET | ORAL | 0 refills | Status: DC

## 2021-02-02 NOTE — Patient Instructions (Signed)

## 2021-02-02 NOTE — Progress Notes (Signed)
° °  Follow-Up Visit   Subjective  Michelle Mccarthy is a 61 y.o. female who presents for the following: Rash (6 months f/u on rash beneath the breast, pt using [employer] intertrigo cream with a poor response ).  The following portions of the chart were reviewed this encounter and updated as appropriate:   Tobacco   Allergies   Meds   Problems   Med Hx   Surg Hx   Fam Hx      Review of Systems:  No other skin or systemic complaints except as noted in HPI or Assessment and Plan.  Objective  Well appearing patient in no apparent distress; mood and affect are within normal limits.  A focused examination was performed including chest. Relevant physical exam findings are noted in the Assessment and Plan.  right inframammary, left inframammary Erythema and maceration      left inframammary x 30 (30) Stuck-on, waxy, tan-brown papules   Assessment & Plan  Intertrigo  (exacerbated by inflamed seborrheic keratoses of this area.) right inframammary, left inframammary  Intertrigo is a chronic recurrent rash that occurs in skin fold areas that may be associated with friction; heat; moisture; yeast; fungus; and bacteria.  It is exacerbated by increased movement / activity; sweating; and higher atmospheric temperature.   Cont [employer] intertrigo cream Iodoquinol: 1% Hydrocortisone: 2.5% Niacinamide: 2% Vehicle: Cream  Related Medications fluconazole (DIFLUCAN) 200 MG tablet Take 1 tablet Mon, Wed, Fri  Inflamed seborrheic keratosis (30) left inframammary x 30  Reassured benign age-related growth.  Recommend observation.  Discussed cryotherapy if spot(s) become irritated or inflamed.   We will only treat ISK under the left breast today, recheck in 4 weeks then we may treat the right side   Restart skin medicinals intertrigo cream apply to skin qd-bid  Diflucan 200 mg take 1 tablet Mon, Wed, Fri  #6  Destruction of lesion - left inframammary x 30 Complexity: simple    Destruction method: cryotherapy   Informed consent: discussed and consent obtained   Timeout:  patient name, date of birth, surgical site, and procedure verified Lesion destroyed using liquid nitrogen: Yes   Region frozen until ice ball extended beyond lesion: Yes   Outcome: patient tolerated procedure well with no complications   Post-procedure details: wound care instructions given    Seborrheic Keratoses Right inframammary, left inframammary  - Stuck-on, waxy, tan-brown papules and/or plaques  - Benign-appearing - Discussed benign etiology and prognosis. - Observe - Call for any changes  Return in about 4 weeks (around 03/02/2021) for Intertrigo .  IMarye Round, CMA, am acting as scribe for Sarina Ser, MD .  Documentation: I have reviewed the above documentation for accuracy and completeness, and I agree with the above.  Sarina Ser, MD

## 2021-02-06 ENCOUNTER — Encounter: Payer: Self-pay | Admitting: Dermatology

## 2021-02-07 ENCOUNTER — Ambulatory Visit: Payer: 59 | Admitting: Podiatry

## 2021-02-09 ENCOUNTER — Ambulatory Visit (INDEPENDENT_AMBULATORY_CARE_PROVIDER_SITE_OTHER): Payer: 59 | Admitting: Internal Medicine

## 2021-02-09 ENCOUNTER — Other Ambulatory Visit: Payer: Self-pay

## 2021-02-09 ENCOUNTER — Encounter: Payer: Self-pay | Admitting: Internal Medicine

## 2021-02-09 VITALS — BP 110/70 | HR 93 | Temp 98.0°F | Ht 68.6 in | Wt 182.6 lb

## 2021-02-09 DIAGNOSIS — Z2821 Immunization not carried out because of patient refusal: Secondary | ICD-10-CM

## 2021-02-09 DIAGNOSIS — Z6827 Body mass index (BMI) 27.0-27.9, adult: Secondary | ICD-10-CM | POA: Diagnosis not present

## 2021-02-09 DIAGNOSIS — N951 Menopausal and female climacteric states: Secondary | ICD-10-CM

## 2021-02-09 DIAGNOSIS — Z0000 Encounter for general adult medical examination without abnormal findings: Secondary | ICD-10-CM

## 2021-02-09 NOTE — Progress Notes (Signed)
[name],acting as a Education administrator for [name], MD.,have documented all relevant documentation on the behalf of [name], MD,as directed by  [name], MD while in the presence of [name], MD.  This visit occurred during the SARS-CoV-2 public health emergency.  Safety protocols were in place, including screening questions prior to the visit, additional usage of staff PPE, and extensive cleaning of exam room while observing appropriate contact time as indicated for disinfecting solutions.  Subjective:     Patient ID: Michelle Mccarthy , female    DOB: 28-Jan-1960 , 61 y.o.   MRN: [id number]   Chief Complaint  Patient presents with   Annual Exam    HPI  Patient presents today for a physical. She is followed by Dr.Harris for her GYN care. She was last seen in 2022. She is due to schedule an appt with him in the near future. She has no new concerns.     Past Medical History:  Diagnosis Date   Arthritis    back   Asthma    Atypical mole 11/27/2017   L mid side/mod   Atypical mole 12/18/2018   L mid to upper back/mod, L lateral buttock/mod   Family history of ovarian cancer    9/20 genetic testing letter sent   Hx of basal cell carcinoma 09/18/2016   multiple sites    Hx of dysplastic nevus 06/11/2017   L ant waistline lateral/excision   Hx of migraines    Insomnia    Menopausal and female climacteric states    Migraine headache    2-3/week   Motion sickness    cars - back seat, circular motion   Postmenopausal atrophic vaginitis      Family History  Problem Relation Age of Onset   Dementia Mother    Colon cancer Father    Ovarian cancer Maternal Aunt 60   Breast cancer Neg Hx      Current Outpatient Medications:    albuterol (PROVENTIL HFA;VENTOLIN HFA) 108 (90 Base) MCG/ACT inhaler, , Disp: , Rfl:    azelastine (ASTELIN) 0.1 % nasal spray, USE 1 SPRAY IN EACH NOSTRIL TWICE A DAY NASALLY 30 DAY, Disp: , Rfl: 5   b complex vitamins  capsule, Take 1 capsule by mouth daily., Disp: , Rfl:    BOTOX 100 units SOLR injection, every 3 (three) months. For migraines, Disp: , Rfl:    Cholecalciferol (VITAMIN D3) 5000 units TABS, Take 1 capsule by mouth once. 2,500 units daily, Disp: , Rfl:    EPINEPHrine 0.3 mg/0.3 mL IJ SOAJ injection, AS DIRECTED INJECTION 30, Disp: , Rfl:    Fluticasone-Umeclidin-Vilant (TRELEGY ELLIPTA) 200-62.5-25 MCG/INH AEPB, 1 puff, Disp: , Rfl:    Galcanezumab-gnlm 120 MG/ML SOAJ, Inject into the skin. One injection monthly, Disp: , Rfl:    imipramine (TOFRANIL) 25 MG tablet, Take 25 mg by mouth daily., Disp: , Rfl:    levocetirizine (XYZAL) 5 MG tablet, Take 5 mg by mouth every evening., Disp: , Rfl:    Magnesium 250 MG TABS, Take by mouth. 1 tablet daily, Disp: , Rfl:    Multiple Vitamin (MULTIVITAMIN) tablet, Take 1 tablet by mouth daily., Disp: , Rfl:    omeprazole (PRILOSEC) 20 MG capsule, Take 20 mg by mouth daily as needed., Disp: , Rfl:    POTASSIUM CHLORIDE PO, Take 1 tablet by mouth once. 550 mg daily, Disp: , Rfl:    TURMERIC CURCUMIN PO, Take by mouth daily., Disp: , Rfl:  valACYclovir (VALTREX) 500 MG tablet, TAKE 1 TABLET (500 MG TOTAL) BY MOUTH 2 (TWO) TIMES DAILY FOR 7 DAYS. AS NEEDED FOR FLARES, Disp: 180 tablet, Rfl: 1   vitamin E 1000 UNIT capsule, Take 400 Units by mouth daily. , Disp: , Rfl:    fluconazole (DIFLUCAN) 200 MG tablet, Take 1 tablet Mon, Wed, Fri (Patient not taking: Reported on 02/09/2021), Disp: 6 tablet, Rfl: 0   No Known Allergies    The patient states she uses post menopausal status for birth control. Last LMP was Patient's last menstrual period was 04/06/2014.. Negative for Dysmenorrhea. Negative for: breast discharge, breast lump(s), breast pain and breast self exam. Associated symptoms include abnormal vaginal bleeding. Pertinent negatives include abnormal bleeding (hematology), anxiety, decreased libido, depression, difficulty falling sleep, dyspareunia, history of  infertility, nocturia, sexual dysfunction, sleep disturbances, urinary incontinence, urinary urgency, vaginal discharge and vaginal itching. Diet regular.The patient states her exercise level is    . The patient's tobacco use is:  Social History   Tobacco Use  Smoking Status Never  Smokeless Tobacco Never  . She has been exposed to passive smoke. The patient's alcohol use is:  Social History   Substance and Sexual Activity  Alcohol Use Yes   Alcohol/week: 2.0 - 4.0 standard drinks   Types: 2 - 4 Standard drinks or equivalent per week   Comment: weekends   Review of Systems  Constitutional: Negative.   HENT: Negative.    Eyes: Negative.   Respiratory: Negative.    Cardiovascular: Negative.   Gastrointestinal: Negative.   Endocrine: Negative.   Genitourinary: Negative.   Musculoskeletal: Negative.   Skin: Negative.   Allergic/Immunologic: Negative.   Neurological: Negative.   Hematological: Negative.   Psychiatric/Behavioral: Negative.      Today's Vitals   02/09/21 0929  BP: 110/70  Pulse: 93  Temp: 98 F (36.7 C)  Weight: 182 lb 9.6 oz (82.8 kg)  Height: 5' 8.6" (1.742 m)  PainSc: 0-No pain   Body mass index is 27.28 kg/m.  Wt Readings from Last 3 Encounters:  02/09/21 182 lb 9.6 oz (82.8 kg)  09/20/20 183 lb 12.8 oz (83.4 kg)  05/05/20 181 lb 6.4 oz (82.3 kg)     Objective:  Physical Exam Vitals and nursing note reviewed.  Constitutional:      Appearance: Normal appearance.  HENT:     Head: Normocephalic and atraumatic.     Right Ear: Tympanic membrane, ear canal and external ear normal.     Left Ear: Tympanic membrane, ear canal and external ear normal.     Nose:     Comments: MASKED     Mouth/Throat:     Comments: MASKED  Eyes:     Extraocular Movements: Extraocular movements intact.     Conjunctiva/sclera: Conjunctivae normal.     Pupils: Pupils are equal, round, and reactive to light.  Cardiovascular:     Rate and Rhythm: Normal rate and  regular rhythm.     Pulses: Normal pulses.     Heart sounds: Normal heart sounds.  Pulmonary:     Effort: Pulmonary effort is normal.     Breath sounds: Normal breath sounds.  Chest:  Breasts:    Tanner Score is 5.     Right: No inverted nipple.     Comments: She elected to keep her bra on.  Abdominal:     General: Abdomen is flat. Bowel sounds are normal.     Palpations: Abdomen is soft.  Genitourinary:  Comments: deferred Musculoskeletal:        General: Normal range of motion.     Cervical back: Normal range of motion and neck supple.  Skin:    General: Skin is warm and dry.  Neurological:     General: No focal deficit present.     Mental Status: She is alert and oriented to person, place, and time.  Psychiatric:        Mood and Affect: Mood normal.        Behavior: Behavior normal.        Assessment And Plan:     1. Encounter for general adult medical examination w/o abnormal findings Comments: A full exam was performed. Importance of monthly self breast exams was discussed with the patient. PATIENT IS ADVISED TO GET 30-45 MINUTES REGULAR EXERCISE NO LESS THAN FOUR TO FIVE DAYS PER WEEK - BOTH WEIGHTBEARING EXERCISES AND AEROBIC ARE RECOMMENDED.  PATIENT IS ADVISED TO FOLLOW A HEALTHY DIET WITH AT LEAST SIX FRUITS/VEGGIES PER DAY, DECREASE INTAKE OF RED MEAT, AND TO INCREASE FISH INTAKE TO TWO DAYS PER WEEK.  MEATS/FISH SHOULD NOT BE FRIED, BAKED OR BROILED IS PREFERABLE.  IT IS ALSO IMPORTANT TO CUT BACK ON YOUR SUGAR INTAKE. PLEASE AVOID ANYTHING WITH ADDED SUGAR, CORN SYRUP OR OTHER SWEETENERS. IF YOU MUST USE A SWEETENER, YOU CAN TRY STEVIA. IT IS ALSO IMPORTANT TO AVOID ARTIFICIALLY SWEETENERS AND DIET BEVERAGES. LASTLY, I SUGGEST WEARING SPF 50 SUNSCREEN ON EXPOSED PARTS AND ESPECIALLY WHEN IN THE DIRECT SUNLIGHT FOR AN EXTENDED PERIOD OF TIME.  PLEASE AVOID FAST FOOD RESTAURANTS AND INCREASE YOUR WATER INTAKE.  - CMP14+EGFR - CBC - Lipid panel - TSH  2. 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 She will c/w progesterone 100/DHEA 29m nightly.  She will f/u in 4 months for re-evaluation.   3. BMI 27.0-27.9,adult Comments: She is encouraged to aim for at least 150 minutes of exercise per week.   4. Herpes zoster vaccination declined  Patient was given opportunity to ask questions. Patient verbalized understanding of the plan and was able to repeat key elements of the plan. All questions were answered to their satisfaction.   I, RMaximino Greenland MD, have reviewed all documentation for this visit. The documentation on 02/09/21 for the exam, diagnosis, procedures, and orders are all accurate and complete.   THE PATIENT IS ENCOURAGED TO PRACTICE SOCIAL DISTANCING DUE TO THE COVID-19 PANDEMIC.

## 2021-02-09 NOTE — Patient Instructions (Addendum)
Dandelion root tea  Health Maintenance, Female Adopting a healthy lifestyle and getting preventive care are important in promoting health and wellness. Ask your health care provider about: The right schedule for you to have regular tests and exams. Things you can do on your own to prevent diseases and keep yourself healthy. What should I know about diet, weight, and exercise? Eat a healthy diet  Eat a diet that includes plenty of vegetables, fruits, low-fat dairy products, and lean protein. Do not eat a lot of foods that are high in solid fats, added sugars, or sodium. Maintain a healthy weight Body mass index (BMI) is used to identify weight problems. It estimates body fat based on height and weight. Your health care provider can help determine your BMI and help you achieve or maintain a healthy weight. Get regular exercise Get regular exercise. This is one of the most important things you can do for your health. Most adults should: Exercise for at least 150 minutes each week. The exercise should increase your heart rate and make you sweat (moderate-intensity exercise). Do strengthening exercises at least twice a week. This is in addition to the moderate-intensity exercise. Spend less time sitting. Even light physical activity can be beneficial. Watch cholesterol and blood lipids Have your blood tested for lipids and cholesterol at 62 years of age, then have this test every 5 years. Have your cholesterol levels checked more often if: Your lipid or cholesterol levels are high. You are older than 62 years of age. You are at high risk for heart disease. What should I know about cancer screening? Depending on your health history and family history, you may need to have cancer screening at various ages. This may include screening for: Breast cancer. Cervical cancer. Colorectal cancer. Skin cancer. Lung cancer. What should I know about heart disease, diabetes, and high blood  pressure? Blood pressure and heart disease High blood pressure causes heart disease and increases the risk of stroke. This is more likely to develop in people who have high blood pressure readings or are overweight. Have your blood pressure checked: Every 3-5 years if you are 8-38 years of age. Every year if you are 70 years old or older. Diabetes Have regular diabetes screenings. This checks your fasting blood sugar level. Have the screening done: Once every three years after age 63 if you are at a normal weight and have a low risk for diabetes. More often and at a younger age if you are overweight or have a high risk for diabetes. What should I know about preventing infection? Hepatitis B If you have a higher risk for hepatitis B, you should be screened for this virus. Talk with your health care provider to find out if you are at risk for hepatitis B infection. Hepatitis C Testing is recommended for: Everyone born from [birth year] through 1966. Anyone with known risk factors for hepatitis C. Sexually transmitted infections (STIs) Get screened for STIs, including gonorrhea and chlamydia, if: You are sexually active and are younger than 62 years of age. You are older than 62 years of age and your health care provider tells you that you are at risk for this type of infection. Your sexual activity has changed since you were last screened, and you are at increased risk for chlamydia or gonorrhea. Ask your health care provider if you are at risk. Ask your health care provider about whether you are at high risk for HIV. Your health care provider may recommend a prescription medicine  to help prevent HIV infection. If you choose to take medicine to prevent HIV, you should first get tested for HIV. You should then be tested every 3 months for as long as you are taking the medicine. Pregnancy If you are about to stop having your period (premenopausal) and you may become pregnant, seek counseling before you  get pregnant. Take 400 to 800 micrograms (mcg) of folic acid every day if you become pregnant. Ask for birth control (contraception) if you want to prevent pregnancy. Osteoporosis and menopause Osteoporosis is a disease in which the bones lose minerals and strength with aging. This can result in bone fractures. If you are 73 years old or older, or if you are at risk for osteoporosis and fractures, ask your health care provider if you should: Be screened for bone loss. Take a calcium or vitamin D supplement to lower your risk of fractures. Be given hormone replacement therapy (HRT) to treat symptoms of menopause. Follow these instructions at home: Alcohol use Do not drink alcohol if: Your health care provider tells you not to drink. You are pregnant, may be pregnant, or are planning to become pregnant. If you drink alcohol: Limit how much you have to: 0-1 drink a day. Know how much alcohol is in your drink. In the [place] one drink equals one 12 oz bottle of beer (355 mL), one 5 oz glass of wine (148 mL), or one 1 oz glass of hard liquor (44 mL). Lifestyle Do not use any products that contain nicotine or tobacco. These products include cigarettes, chewing tobacco, and vaping devices, such as e-cigarettes. If you need help quitting, ask your health care provider. Do not use street drugs. Do not share needles. Ask your health care provider for help if you need support or information about quitting drugs. General instructions Schedule regular health, dental, and eye exams. Stay current with your vaccines. Tell your health care provider if: You often feel depressed. You have ever been abused or do not feel safe at home. Summary Adopting a healthy lifestyle and getting preventive care are important in promoting health and wellness. Follow your health care provider's instructions about healthy diet, exercising, and getting tested or screened for diseases. Follow your health care provider's  instructions on monitoring your cholesterol and blood pressure. This information is not intended to replace advice given to you by your health care provider. Make sure you discuss any questions you have with your health care provider. Document Revised: 05/16/2020 Document Reviewed: 05/16/2020 Elsevier Patient Education  [place].

## 2021-02-10 LAB — CMP14+EGFR
ALT: 23 IU/L (ref 0–32)
AST: 26 IU/L (ref 0–40)
Albumin/Globulin Ratio: 2 (ref 1.2–2.2)
Albumin: 4.7 g/dL (ref 3.8–4.8)
Alkaline Phosphatase: 82 IU/L (ref 44–121)
BUN/Creatinine Ratio: 15 (ref 12–28)
BUN: 11 mg/dL (ref 8–27)
Bilirubin Total: <0.2 mg/dL (ref 0.0–1.2)
CO2: 23 mmol/L (ref 20–29)
Calcium: 9.6 mg/dL (ref 8.7–10.3)
Chloride: 101 mmol/L (ref 96–106)
Creatinine, Ser: 0.73 mg/dL (ref 0.57–1.00)
Globulin, Total: 2.3 g/dL (ref 1.5–4.5)
Glucose: 99 mg/dL (ref 70–99)
Potassium: 4.7 mmol/L (ref 3.5–5.2)
Sodium: 141 mmol/L (ref 134–144)
Total Protein: 7 g/dL (ref 6.0–8.5)
eGFR: 94 mL/min/{1.73_m2} (ref >59)

## 2021-02-10 LAB — CBC
Hematocrit: 40.3 % (ref 34.0–46.6)
Hemoglobin: 14 g/dL (ref 11.1–15.9)
MCH: 31.5 pg (ref 26.6–33.0)
MCHC: 34.7 g/dL (ref 31.5–35.7)
MCV: 91 fL (ref 79–97)
Platelets: 348 10*3/uL (ref 150–450)
RBC: 4.44 x10E6/uL (ref 3.77–5.28)
RDW: 13.1 % (ref 11.7–15.4)
WBC: 6.7 10*3/uL (ref 3.4–10.8)

## 2021-02-10 LAB — LIPID PANEL
Chol/HDL Ratio: 2.7 ratio (ref 0.0–4.4)
Cholesterol, Total: 266 mg/dL — ABNORMAL HIGH (ref 100–199)
HDL: 98 mg/dL (ref >39)
LDL Chol Calc (NIH): 156 mg/dL — ABNORMAL HIGH (ref 0–99)
Triglycerides: 76 mg/dL (ref 0–149)
VLDL Cholesterol Cal: 12 mg/dL (ref 5–40)

## 2021-02-10 LAB — TSH: TSH: 2.44 u[IU]/mL (ref 0.450–4.500)

## 2021-02-21 ENCOUNTER — Other Ambulatory Visit: Payer: Self-pay

## 2021-02-21 ENCOUNTER — Ambulatory Visit: Payer: 59 | Admitting: Podiatry

## 2021-02-21 DIAGNOSIS — M76822 Posterior tibial tendinitis, left leg: Secondary | ICD-10-CM | POA: Diagnosis not present

## 2021-02-21 DIAGNOSIS — T148XXA Other injury of unspecified body region, initial encounter: Secondary | ICD-10-CM | POA: Diagnosis not present

## 2021-02-24 ENCOUNTER — Encounter: Payer: Self-pay | Admitting: Podiatry

## 2021-02-24 NOTE — Progress Notes (Signed)
Subjective:  Patient ID: Michelle Mccarthy, female    DOB: 1960-03-04,  MRN: [id number]  Chief Complaint  Patient presents with   Foot Pain    Left foot pain  Pt stated that she is still having some pain      61 y.o. female presents with the above complaint.  Patient presents with follow-up of left ankle lateral/medial pain.  She states the cam boot immobilization helped some.  There is still some pain and swelling.  She would like to know if she can do another injection.  She would like to discuss other treatment plans.   Review of Systems: Negative except as noted in the HPI. Denies N/V/F/Ch.  Past Medical History:  Diagnosis Date   Arthritis    back   Asthma    Atypical mole 11/27/2017   L mid side/mod   Atypical mole 12/18/2018   L mid to upper back/mod, L lateral buttock/mod   Family history of ovarian cancer    9/20 genetic testing letter sent   Hx of basal cell carcinoma 09/18/2016   multiple sites    Hx of dysplastic nevus 06/11/2017   L ant waistline lateral/excision   Hx of migraines    Insomnia    Menopausal and female climacteric states    Migraine headache    2-3/week   Motion sickness    cars - back seat, circular motion   Postmenopausal atrophic vaginitis     Current Outpatient Medications:    albuterol (PROVENTIL HFA;VENTOLIN HFA) 108 (90 Base) MCG/ACT inhaler, , Disp: , Rfl:    azelastine (ASTELIN) 0.1 % nasal spray, USE 1 SPRAY IN EACH NOSTRIL TWICE A DAY NASALLY 30 DAY, Disp: , Rfl: 5   b complex vitamins capsule, Take 1 capsule by mouth daily., Disp: , Rfl:    BOTOX 100 units SOLR injection, every 3 (three) months. For migraines, Disp: , Rfl:    Cholecalciferol (VITAMIN D3) 5000 units TABS, Take 1 capsule by mouth once. 2,500 units daily, Disp: , Rfl:    EPINEPHrine 0.3 mg/0.3 mL IJ SOAJ injection, AS DIRECTED INJECTION 30, Disp: , Rfl:    fluconazole (DIFLUCAN) 200 MG tablet, Take 1 tablet Mon, Wed, Fri (Patient not taking: Reported on 02/09/2021),  Disp: 6 tablet, Rfl: 0   Fluticasone-Umeclidin-Vilant (TRELEGY ELLIPTA) 200-62.5-25 MCG/INH AEPB, 1 puff, Disp: , Rfl:    Galcanezumab-gnlm 120 MG/ML SOAJ, Inject into the skin. One injection monthly, Disp: , Rfl:    imipramine (TOFRANIL) 25 MG tablet, Take 25 mg by mouth daily., Disp: , Rfl:    levocetirizine (XYZAL) 5 MG tablet, Take 5 mg by mouth every evening., Disp: , Rfl:    Magnesium 250 MG TABS, Take by mouth. 1 tablet daily, Disp: , Rfl:    Multiple Vitamin (MULTIVITAMIN) tablet, Take 1 tablet by mouth daily., Disp: , Rfl:    omeprazole (PRILOSEC) 20 MG capsule, Take 20 mg by mouth daily as needed., Disp: , Rfl:    POTASSIUM CHLORIDE PO, Take 1 tablet by mouth once. 550 mg daily, Disp: , Rfl:    TURMERIC CURCUMIN PO, Take by mouth daily., Disp: , Rfl:    valACYclovir (VALTREX) 500 MG tablet, TAKE 1 TABLET (500 MG TOTAL) BY MOUTH 2 (TWO) TIMES DAILY FOR 7 DAYS. AS NEEDED FOR FLARES, Disp: 180 tablet, Rfl: 1   vitamin E 1000 UNIT capsule, Take 400 Units by mouth daily. , Disp: , Rfl:   Social History   Tobacco Use  Smoking Status Never  Smokeless  Tobacco Never    No Known Allergies Objective:  There were no vitals filed for this visit. There is no height or weight on file to calculate BMI. Constitutional Well developed. Well nourished.  Vascular Dorsalis pedis pulses palpable bilaterally. Posterior tibial pulses palpable bilaterally. Capillary refill normal to all digits.  No cyanosis or clubbing noted. Pedal hair growth normal.  Neurologic Normal speech. Oriented to person, place, and time. Epicritic sensation to light touch grossly present bilaterally.  Dermatologic Nails well groomed and normal in appearance. No open wounds. No skin lesions.  Orthopedic: No further pain palpation to the left ankle pain circumferential around the ankle joint as well as ATFL ligament.  Mild pain with plantarflexion inversion of the foot.  No pain with dorsiflexion eversion of the foot.   No pain with Achilles tendon, peroneal tendon, posterior tibial palpation. Pain on palpation of the right heel  Pain on palpation to the left posterior tibial tendon.  No pain at the insertion of the posterior tibia.  Pain along the course of the posterior tibia.  Pain with resisted inversion plantarflexion of the foot.  Tender to palpation at the calcaneal tuber right. No pain with calcaneal squeeze right. Ankle ROM diminished range of motion right. Silfverskiold Test: positive right.    Radiographs: Bilateral midfoot arthritis noted.  Heel spurring noted bilaterally.  No other bony abnormalities identified.  No stress fracture noted no actual fracture noted.  1. Moderate tenosynovitis of the posterior tibial tendon and flexor digitorum tendon above and below the medial malleolus. 2. Partial tear of the anterior tibiofibular ligament. 3. Very mild tenosynovitis of the inframalleolar peroneal tendons. No acute tendon tear. 4. No acute osseous abnormality.   Assessment:   1. Posterior tibial tendinitis, left   2. Ligament tear      Plan:  Patient was evaluated and treated and all questions answered.  Posterior tibial tendinitis left -I explained patient etiology of posterior tibial tendinitis versus treatment options were discussed.  Over the last few weeks pain has started transferring more to the medial side where she is having small swelling around the posterior tibial tendon.  She would like to discuss treatment options for this.  She is unable to wear further cam boot as it is messing with her back.  I believe she will benefit from a steroid injection of decrease acute inflammatory component associated pain.  I discussed with the patient that there is a risk of rupture associated with it.  She states understand like to proceed with injection. -A second steroid injection was performed at right medial foot at point of maximal tenderness using 1% plain Lidocaine and 10 mg of Kenalog.  This was well tolerated. -She will benefit from physical therapy for general conditioning as well as foot exercises.  She will be sent to renew physical therapy   Left ankle sprain/ankle capsulitis versus osteochondral lesion -I explained the patient the etiology ankle sprain extracapsular emergency room and options were discussed.  -Clinically the cam boot immobilization and steroid injection helped considerably.  She no longer has pain on the lateral side of her foot.  MRI was reviewed with the patient and discussed which shows partial tear of the ATFL ligament.   Plantar Fasciitis, right -Clinically resolving and doing well.  No follow-ups on file.    No follow-ups on file.

## 2021-03-09 ENCOUNTER — Ambulatory Visit: Payer: 59 | Admitting: Dermatology

## 2021-03-18 ENCOUNTER — Other Ambulatory Visit: Payer: Self-pay | Admitting: Dermatology

## 2021-03-18 DIAGNOSIS — B009 Herpesviral infection, unspecified: Secondary | ICD-10-CM

## 2021-04-07 ENCOUNTER — Telehealth: Payer: Self-pay

## 2021-04-07 NOTE — Telephone Encounter (Signed)
The pt stated that she needed a refill on her hormone medications and that Dr. Baird Cancer wanted the pt to call the office when it was time for her to have a refill because she thinks Dr. Baird Cancer wanted to discuss the refills because the medications maybe changed.  ?

## 2021-04-13 ENCOUNTER — Ambulatory Visit: Payer: 59 | Admitting: Internal Medicine

## 2021-04-18 ENCOUNTER — Ambulatory Visit: Payer: 59 | Admitting: Podiatry

## 2021-04-18 DIAGNOSIS — M76822 Posterior tibial tendinitis, left leg: Secondary | ICD-10-CM | POA: Diagnosis not present

## 2021-04-18 NOTE — Progress Notes (Signed)
?Subjective:  ?Patient ID: Michelle Mccarthy, female    DOB: [birth year],  MRN: [id number] ? ?Chief Complaint  ?Patient presents with  ? Foot Pain  ? ? ? ?61 y.o. female presents with the above complaint.  Patient presents with follow-up of left ankle lateral/medial pain.  She states she is doing a lot better.  The physical therapy helps.  She still swells but overall pain has gone down considerably. ? ?Review of Systems: Negative except as noted in the HPI. Denies N/V/F/Ch. ? ?Past Medical History:  ?Diagnosis Date  ? Arthritis   ? back  ? Asthma   ? Atypical mole 11/27/2017  ? L mid side/mod  ? Atypical mole 12/18/2018  ? L mid to upper back/mod, L lateral buttock/mod  ? Family history of ovarian cancer   ? 9/20 genetic testing letter sent  ? Hx of basal cell carcinoma 09/18/2016  ? multiple sites   ? Hx of dysplastic nevus 06/11/2017  ? L ant waistline lateral/excision  ? Hx of migraines   ? Insomnia   ? Menopausal and female climacteric states   ? Migraine headache   ? 2-3/week  ? Motion sickness   ? cars - back seat, circular motion  ? Postmenopausal atrophic vaginitis   ? ? ?Current Outpatient Medications:  ?  albuterol (PROVENTIL HFA;VENTOLIN HFA) 108 (90 Base) MCG/ACT inhaler, , Disp: , Rfl:  ?  azelastine (ASTELIN) 0.1 % nasal spray, USE 1 SPRAY IN EACH NOSTRIL TWICE A DAY NASALLY 30 DAY, Disp: , Rfl: 5 ?  b complex vitamins capsule, Take 1 capsule by mouth daily., Disp: , Rfl:  ?  BOTOX 100 units SOLR injection, every 3 (three) months. For migraines, Disp: , Rfl:  ?  Cholecalciferol (VITAMIN D3) 5000 units TABS, Take 1 capsule by mouth once. 2,500 units daily, Disp: , Rfl:  ?  EPINEPHrine 0.3 mg/0.3 mL IJ SOAJ injection, AS DIRECTED INJECTION 30, Disp: , Rfl:  ?  fluconazole (DIFLUCAN) 200 MG tablet, Take 1 tablet Mon, Wed, Fri (Patient not taking: Reported on 02/09/2021), Disp: 6 tablet, Rfl: 0 ?  Fluticasone-Umeclidin-Vilant (TRELEGY ELLIPTA) 200-62.5-25 MCG/INH AEPB, 1 puff, Disp: , Rfl:  ?  Galcanezumab-gnlm  120 MG/ML SOAJ, Inject into the skin. One injection monthly, Disp: , Rfl:  ?  imipramine (TOFRANIL) 25 MG tablet, Take 25 mg by mouth daily., Disp: , Rfl:  ?  levocetirizine (XYZAL) 5 MG tablet, Take 5 mg by mouth every evening., Disp: , Rfl:  ?  Magnesium 250 MG TABS, Take by mouth. 1 tablet daily, Disp: , Rfl:  ?  Multiple Vitamin (MULTIVITAMIN) tablet, Take 1 tablet by mouth daily., Disp: , Rfl:  ?  omeprazole (PRILOSEC) 20 MG capsule, Take 20 mg by mouth daily as needed., Disp: , Rfl:  ?  POTASSIUM CHLORIDE PO, Take 1 tablet by mouth once. 550 mg daily, Disp: , Rfl:  ?  TURMERIC CURCUMIN PO, Take by mouth daily., Disp: , Rfl:  ?  valACYclovir (VALTREX) 500 MG tablet, TAKE 1 TABLET (500 MG TOTAL) BY MOUTH 2 (TWO) TIMES DAILY FOR 7 DAYS. AS NEEDED FOR FLARES, Disp: 180 tablet, Rfl: 1 ?  vitamin E 1000 UNIT capsule, Take 400 Units by mouth daily. , Disp: , Rfl:  ? ?Social History  ? ?Tobacco Use  ?Smoking Status Never  ?Smokeless Tobacco Never  ? ? ?No Known Allergies ?Objective:  ?There were no vitals filed for this visit. ?There is no height or weight on file to calculate BMI. ?Constitutional Well  developed. ?Well nourished.  ?Vascular Dorsalis pedis pulses palpable bilaterally. ?Posterior tibial pulses palpable bilaterally. ?Capillary refill normal to all digits.  ?No cyanosis or clubbing noted. ?Pedal hair growth normal.  ?Neurologic Normal speech. ?Oriented to person, place, and time. ?Epicritic sensation to light touch grossly present bilaterally.  ?Dermatologic Nails well groomed and normal in appearance. ?No open wounds. ?No skin lesions.  ?Orthopedic: No further pain palpation to the left ankle pain circumferential around the ankle joint as well as ATFL ligament.  Mild pain with plantarflexion inversion of the foot.  No pain with dorsiflexion eversion of the foot.  No pain with Achilles tendon, peroneal tendon, posterior tibial palpation. ?Pain on palpation of the right heel ? ?Mild pain on palpation to  the left posterior tibial tendon.  No pain at the insertion of the posterior tibia.  Mild pain along the course of the posterior tibia.  Mild pain with resisted inversion plantarflexion of the foot. ? ?Tender to palpation at the calcaneal tuber right. ?No pain with calcaneal squeeze right. ?Ankle ROM diminished range of motion right. ?Silfverskiold Test: positive right. ?  ? ?Radiographs: Bilateral midfoot arthritis noted.  Heel spurring noted bilaterally.  No other bony abnormalities identified.  No stress fracture noted no actual fracture noted. ? ?1. Moderate tenosynovitis of the posterior tibial tendon and flexor ?digitorum tendon above and below the medial malleolus. ?2. Partial tear of the anterior tibiofibular ligament. ?3. Very mild tenosynovitis of the inframalleolar peroneal tendons. ?No acute tendon tear. ?4. No acute osseous abnormality. ?  ?Assessment:  ? ?No diagnosis found. ? ? ? ?Plan:  ?Patient was evaluated and treated and all questions answered. ? ?Posterior tibial tendinitis left ?-I explained patient etiology of posterior tibial tendinitis versus treatment options were discussed.  Over the past few weeks her pain has improved.  The physical therapy has been helping therefore I discussed with her to continue doing physical therapy for neck ?-I will hold off on any further injection as she has improved considerably. ?-Continue doing physical therapy ? ? ?Left ankle sprain/ankle capsulitis versus osteochondral lesion ?-I explained the patient the etiology ankle sprain extracapsular emergency room and options were discussed.  ?-Clinically the cam boot immobilization and steroid injection helped considerably.  She no longer has pain on the lateral side of her foot.  MRI was reviewed with the patient and discussed which shows partial tear of the ATFL ligament. ? ? ?Plantar Fasciitis, right ?-Clinically resolving and doing well. ? ?No follow-ups on file. ? ? ? ?No follow-ups on file.  ? ?

## 2021-04-19 ENCOUNTER — Other Ambulatory Visit: Payer: Self-pay | Admitting: Internal Medicine

## 2021-04-19 DIAGNOSIS — Z1231 Encounter for screening mammogram for malignant neoplasm of breast: Secondary | ICD-10-CM

## 2021-05-16 ENCOUNTER — Ambulatory Visit
Admission: RE | Admit: 2021-05-16 | Discharge: 2021-05-16 | Disposition: A | Discharge status: Home / Self Care | Payer: 59 | Source: Ambulatory Visit | Attending: Internal Medicine | Admitting: Internal Medicine

## 2021-05-16 DIAGNOSIS — Z1231 Encounter for screening mammogram for malignant neoplasm of breast: Secondary | ICD-10-CM | POA: Diagnosis present

## 2021-05-17 ENCOUNTER — Other Ambulatory Visit: Payer: Self-pay | Admitting: Internal Medicine

## 2021-05-17 DIAGNOSIS — R928 Other abnormal and inconclusive findings on diagnostic imaging of breast: Secondary | ICD-10-CM

## 2021-05-17 DIAGNOSIS — N6489 Other specified disorders of breast: Secondary | ICD-10-CM

## 2021-05-25 ENCOUNTER — Ambulatory Visit
Admission: RE | Admit: 2021-05-25 | Discharge: 2021-05-25 | Disposition: A | Discharge status: Home / Self Care | Payer: 59 | Source: Ambulatory Visit | Attending: Internal Medicine | Admitting: Internal Medicine

## 2021-05-25 DIAGNOSIS — R928 Other abnormal and inconclusive findings on diagnostic imaging of breast: Secondary | ICD-10-CM

## 2021-05-25 DIAGNOSIS — N6489 Other specified disorders of breast: Secondary | ICD-10-CM

## 2021-06-20 ENCOUNTER — Ambulatory Visit: Payer: 59 | Admitting: Internal Medicine

## 2021-06-20 ENCOUNTER — Encounter: Payer: Self-pay | Admitting: Internal Medicine

## 2021-06-20 VITALS — BP 106/80 | HR 78 | Temp 98.4°F | Ht 68.0 in | Wt 171.4 lb

## 2021-06-20 DIAGNOSIS — N951 Menopausal and female climacteric states: Secondary | ICD-10-CM | POA: Diagnosis not present

## 2021-06-20 DIAGNOSIS — F5101 Primary insomnia: Secondary | ICD-10-CM

## 2021-06-20 DIAGNOSIS — Z23 Encounter for immunization: Secondary | ICD-10-CM | POA: Diagnosis not present

## 2021-06-20 DIAGNOSIS — Z6825 Body mass index (BMI) 25.0-25.9, adult: Secondary | ICD-10-CM | POA: Diagnosis not present

## 2021-06-20 DIAGNOSIS — Z8249 Family history of ischemic heart disease and other diseases of the circulatory system: Secondary | ICD-10-CM | POA: Diagnosis not present

## 2021-06-20 NOTE — Progress Notes (Signed)
I,Michelle Mccarthy,acting as a scribe for Michelle Greenland, MD.,have documented all relevant documentation on the behalf of Michelle Greenland, MD,as directed by  Michelle Greenland, MD while in the presence of Michelle Greenland, MD.  This visit occurred during the SARS-CoV-2 public health emergency.  Safety protocols were in place, including screening questions prior to the visit, additional usage of staff PPE, and extensive cleaning of exam room while observing appropriate contact time as indicated for disinfecting solutions.  Subjective:     Patient ID: Michelle Mccarthy , female    DOB: 12/22/1960 , 61 y.o.   MRN: [id number]   No chief complaint on file.   HPI  She presents today for f/u on her hormones.  She is currently taking progesterone/DHEA 75.7.'5mg'$  nightly. She has not had any issues with BHRT. She is having some hot flashes on occasion. Takes a natural supplement for sleep.She is ready to start weaning off of BHRT.     Past Medical History:  Diagnosis Date   Arthritis    back   Asthma    Atypical mole 11/27/2017   L mid side/mod   Atypical mole 12/18/2018   L mid to upper back/mod, L lateral buttock/mod   Family history of ovarian cancer    9/20 genetic testing letter sent   Hx of basal cell carcinoma 09/18/2016   multiple sites    Hx of dysplastic nevus 06/11/2017   L ant waistline lateral/excision   Hx of migraines    Insomnia    Menopausal and female climacteric states    Migraine headache    2-3/week   Motion sickness    cars - back seat, circular motion   Postmenopausal atrophic vaginitis      Family History  Problem Relation Age of Onset   Dementia Mother    Colon cancer Father    Ovarian cancer Maternal Aunt 60   Breast cancer Neg Hx      Current Outpatient Medications:    albuterol (PROVENTIL HFA;VENTOLIN HFA) 108 (90 Base) MCG/ACT inhaler, , Disp: , Rfl:    azelastine (ASTELIN) 0.1 % nasal spray, USE 1 SPRAY IN EACH NOSTRIL TWICE A DAY NASALLY 30 DAY,  Disp: , Rfl: 5   b complex vitamins capsule, Take 1 capsule by mouth daily., Disp: , Rfl:    BOTOX 100 units SOLR injection, every 3 (three) months. For migraines, Disp: , Rfl:    Cholecalciferol (VITAMIN D3) 5000 units TABS, Take 1 capsule by mouth once. 2,500 units daily, Disp: , Rfl:    EPINEPHrine 0.3 mg/0.3 mL IJ SOAJ injection, AS DIRECTED INJECTION 30, Disp: , Rfl:    Fluticasone-Umeclidin-Vilant (TRELEGY ELLIPTA) 200-62.5-25 MCG/INH AEPB, 1 puff, Disp: , Rfl:    Galcanezumab-gnlm 120 MG/ML SOAJ, Inject into the skin. One injection monthly, Disp: , Rfl:    imipramine (TOFRANIL) 25 MG tablet, Take 25 mg by mouth daily., Disp: , Rfl:    levocetirizine (XYZAL) 5 MG tablet, Take 5 mg by mouth every evening., Disp: , Rfl:    Magnesium 250 MG TABS, Take by mouth. 1 tablet daily, Disp: , Rfl:    omeprazole (PRILOSEC) 20 MG capsule, Take 20 mg by mouth daily as needed., Disp: , Rfl:    POTASSIUM CHLORIDE PO, Take 1 tablet by mouth once. 550 mg daily, Disp: , Rfl:    TURMERIC CURCUMIN PO, Take by mouth daily., Disp: , Rfl:    UNABLE TO FIND, Med Name: progesterone SR/DHEA 75/7.'5mg'$  nightly except Sundays, Disp: ,  Rfl:    valACYclovir (VALTREX) 500 MG tablet, TAKE 1 TABLET (500 MG TOTAL) BY MOUTH 2 (TWO) TIMES DAILY FOR 7 DAYS. AS NEEDED FOR FLARES, Disp: 180 tablet, Rfl: 1   vitamin E 1000 UNIT capsule, Take 400 Units by mouth daily. , Disp: , Rfl:    fluconazole (DIFLUCAN) 200 MG tablet, Take 1 tablet Mon, Wed, Fri (Patient not taking: Reported on 02/09/2021), Disp: 6 tablet, Rfl: 0   Multiple Vitamin (MULTIVITAMIN) tablet, Take 1 tablet by mouth daily., Disp: , Rfl:    No Known Allergies   Review of Systems  Constitutional: Negative.   Respiratory: Negative.    Cardiovascular: Negative.   Gastrointestinal: Negative.   Neurological: Negative.   Psychiatric/Behavioral:  Positive for sleep disturbance.      Today's Vitals   06/20/21 0824  BP: 106/80  Pulse: 78  Temp: 98.4 F (36.9 C)   SpO2: 98%  Weight: 171 lb 6.4 oz (77.7 kg)   Body mass index is 25.61 kg/m.  BP Readings from Last 3 Encounters:  06/20/21 106/80  02/09/21 110/70  09/20/20 126/74    Wt Readings from Last 3 Encounters:  06/20/21 171 lb 6.4 oz (77.7 kg)  02/09/21 182 lb 9.6 oz (82.8 kg)  09/20/20 183 lb 12.8 oz (83.4 kg)     Objective:  Physical Exam Vitals and nursing note reviewed.  Constitutional:      Appearance: Normal appearance.  HENT:     Head: Normocephalic and atraumatic.  Eyes:     Extraocular Movements: Extraocular movements intact.  Cardiovascular:     Rate and Rhythm: Normal rate and regular rhythm.     Heart sounds: Normal heart sounds.  Pulmonary:     Effort: Pulmonary effort is normal.     Breath sounds: Normal breath sounds.  Musculoskeletal:     Cervical back: Normal range of motion.  Skin:    General: Skin is warm.  Neurological:     General: No focal deficit present.     Mental Status: She is alert.  Psychiatric:        Mood and Affect: Mood normal.        Behavior: Behavior normal.       Assessment And Plan:     1. Female climacteric state Comments: I will decrease her dose to prog SR 50/DHEA '5mg'$  nightly. After 90 days, I plan to decrease her to 25/2.'5mg'$  nightly. She will f/u in 4 months.   2. Primary insomnia Comments: Chronic, encouraged to follow a bedtime routine. She will c/w current supplementation, hopefully the decrease in progesterone will not alter her sleep pattern.   3. Body mass index (BMI) 25.0-25.9, adult Comments: She is encouraged to aim for at least 150 minutes of exercise per week.   4. Immunization due - Varicella-zoster vaccine IM (Shingrix)  5. Family history of heart disease Comments: Her father had CAD w/ stents. We discussed her chol level and use of CT cardiac score. She will consider at future visit.    The 10-year ASCVD risk score (Arnett DK, et al., 2019) is: 2.2%   Values used to calculate the score:     Age: 61  years     Sex: Female     Is Non-Hispanic African American: No     Diabetic: No     Tobacco smoker: No     Systolic Blood Pressure: 379 mmHg     Is BP treated: No     HDL Cholesterol: 98 mg/dL  Total Cholesterol: 266 mg/dL   Patient was given opportunity to ask questions. Patient verbalized understanding of the plan and was able to repeat key elements of the plan. All questions were answered to their satisfaction.   I, Michelle Greenland, MD, have reviewed all documentation for this visit. The documentation on 06/20/21 for the exam, diagnosis, procedures, and orders are all accurate and complete.   IF YOU HAVE BEEN REFERRED TO A SPECIALIST, IT MAY TAKE 1-2 WEEKS TO SCHEDULE/PROCESS THE REFERRAL. IF YOU HAVE NOT HEARD FROM US/SPECIALIST IN TWO WEEKS, PLEASE GIVE [place] A CALL AT [phone].   THE PATIENT IS ENCOURAGED TO PRACTICE SOCIAL DISTANCING DUE TO THE COVID-19 PANDEMIC.

## 2021-06-20 NOTE — Patient Instructions (Addendum)
Fatigue If you have fatigue, you feel tired all the time and have a lack of energy or a lack of motivation. Fatigue may make it difficult to start or complete tasks because of exhaustion. Occasional or mild fatigue is often a normal response to activity or life. However, long-term (chronic) or extreme fatigue may be a symptom of a medical condition such as: Depression. Not having enough red blood cells or hemoglobin in the blood (anemia). A problem with a small gland located in the lower front part of the neck (thyroid disorder). Rheumatologic conditions. These are problems related to the body's defense system (immune system). Infections, especially certain viral infections. Fatigue can also lead to negative health outcomes over time. Follow these instructions at home: Medicines Take over-the-counter and prescription medicines only as told by your health care provider. Take a multivitamin if told by your health care provider. Do not use herbal or dietary supplements unless they are approved by your health care provider. Eating and drinking  Avoid heavy meals in the evening. Eat a well-balanced diet, which includes lean proteins, whole grains, plenty of fruits and vegetables, and low-fat dairy products. Avoid eating or drinking too many products with caffeine in them. Avoid alcohol. Drink enough fluid to keep your urine pale yellow. Activity  Exercise regularly, as told by your health care provider. Use or practice techniques to help you relax, such as yoga, tai chi, meditation, or massage therapy. Lifestyle Change situations that cause you stress. Try to keep your work and personal schedules in balance. Do not use recreational or illegal drugs. General instructions Monitor your fatigue for any changes. Go to bed and get up at the same time every day. Avoid fatigue by pacing yourself during the day and getting enough sleep at night. Maintain a healthy weight. Contact a health care  provider if: Your fatigue does not get better. You have a fever. You suddenly lose or gain weight. You have headaches. You have trouble falling asleep or sleeping through the night. You feel angry, guilty, anxious, or sad. You have swelling in your legs or another part of your body. Get help right away if: You feel confused, feel like you might faint, or faint. Your vision is blurry or you have a severe headache. You have severe pain in your abdomen, your back, or the area between your waist and hips (pelvis). You have chest pain, shortness of breath, or an irregular or fast heartbeat. You are unable to urinate, or you urinate less than normal. You have abnormal bleeding from the rectum, nose, lungs, nipples, or, if you are female, the vagina. You vomit blood. You have thoughts about hurting yourself or others. These symptoms may be an emergency. Get help right away. Call 911. Do not wait to see if the symptoms will go away. Do not drive yourself to the hospital. Get help right away if you feel like you may hurt yourself or others, or have thoughts about taking your own life. Go to your nearest emergency room or: Call 911. Call the National Suicide Prevention Lifeline at 1-800-273-8255 or 988. This is open 24 hours a day. Text the Crisis Text Line at 741741. Summary If you have fatigue, you feel tired all the time and have a lack of energy or a lack of motivation. Fatigue may make it difficult to start or complete tasks because of exhaustion. Long-term (chronic) or extreme fatigue may be a symptom of a medical condition. Exercise regularly, as told by your health care provider.   Change situations that cause you stress. Try to keep your work and personal schedules in balance. This information is not intended to replace advice given to you by your health care provider. Make sure you discuss any questions you have with your health care provider. Document Revised: 10/17/2020 Document  Reviewed: 10/17/2020 [employer] Patient Education  2023 [employer]  

## 2021-07-05 ENCOUNTER — Encounter: Payer: 59 | Admitting: Dermatology

## 2021-07-06 ENCOUNTER — Ambulatory Visit
Admission: RE | Admit: 2021-07-06 | Discharge: 2021-07-06 | Disposition: A | Discharge status: Home / Self Care | Payer: 59 | Source: Ambulatory Visit | Attending: Family Medicine | Admitting: Family Medicine

## 2021-07-06 VITALS — BP 112/80 | HR 77 | Temp 98.4°F | Resp 18

## 2021-07-06 DIAGNOSIS — J0140 Acute pansinusitis, unspecified: Secondary | ICD-10-CM | POA: Diagnosis not present

## 2021-07-06 MED ORDER — PROMETHAZINE-DM 6.25-15 MG/5ML PO SYRP: 5.0000 mL | ORAL_SOLUTION | Freq: Four times a day (QID) | ORAL | 0 refills | Status: DC | PRN

## 2021-07-06 MED ORDER — DOXYCYCLINE HYCLATE 100 MG PO CAPS: 100.0000 mg | ORAL_CAPSULE | Freq: Two times a day (BID) | ORAL | 0 refills | Status: DC

## 2021-07-06 MED ORDER — PREDNISONE 10 MG PO TABS: 10.0000 mg | ORAL_TABLET | Freq: Every day | ORAL | 0 refills | Status: AC

## 2021-07-06 NOTE — ED Triage Notes (Signed)
Pt presents with ST, congestion, sinus pressure/pain, and cough x 3 weeks.

## 2021-07-06 NOTE — ED Provider Notes (Signed)
Michelle Mccarthy    CSN: [id number] Arrival date & time: 07/06/21  1547      History   Chief Complaint Chief Complaint  Patient presents with   Nasal Congestion    Sore throat. Sinus infection - Entered by patient   Sore Throat   Sinus pressure     HPI Michelle Mccarthy is a 61 y.o. female.   HPI Patient presents today for evaluation of facial pressure, nasal congestion, cough which is occasionally productive. No fever >100. Symptoms have been ongoing for more than 3 weeks.  She takes weekly allergy shots and is on a daily allergy regimen which she is adherent to.  These medications have not alleviated her current symptoms.  She is also an asthmatic and has had to use her albuterol inhaler.  At present she is not actively wheezing or experiencing any shortness of breath.  Past Medical History:  Diagnosis Date   Arthritis    back   Asthma    Atypical mole 11/27/2017   L mid side/mod   Atypical mole 12/18/2018   L mid to upper back/mod, L lateral buttock/mod   Family history of ovarian cancer    9/20 genetic testing letter sent   Hx of basal cell carcinoma 09/18/2016   multiple sites    Hx of dysplastic nevus 06/11/2017   L ant waistline lateral/excision   Hx of migraines    Insomnia    Menopausal and female climacteric states    Migraine headache    2-3/week   Motion sickness    cars - back seat, circular motion   Postmenopausal atrophic vaginitis     Patient Active Problem List   Diagnosis Date Noted   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 Weston Settle   POLYPECTOMY  09/25/2019   Procedure: POLYPECTOMY;  Surgeon: [name], MD;  Location: [facility];  Service: Endoscopy;;    OB History     Gravida  1   Para      Term      Preterm      AB  1   Living         SAB      IAB  1   Ectopic      Multiple      Live Births  0            Home Medications    Prior to Admission medications   Medication Sig Start Date End Date Taking? Authorizing Provider  doxycycline (VIBRAMYCIN) 100 MG capsule Take 1 capsule (100 mg total) by mouth 2 (two) times daily. 07/06/21  Yes Scot Jun, FNP  predniSONE (DELTASONE) 10 MG tablet Take 1 tablet (10 mg total) by mouth daily with breakfast for 5 days. 07/06/21 07/11/21 Yes Scot Jun, FNP  promethazine-dextromethorphan (PROMETHAZINE-DM) 6.25-15 MG/5ML syrup Take 5 mLs by mouth 4 (four) times daily as needed for cough. 07/06/21  Yes Molli Barrows  S, FNP  albuterol (PROVENTIL HFA;VENTOLIN HFA) 108 (90 Base) MCG/ACT inhaler  11/19/17   [provider]  azelastine (ASTELIN) 0.1 % nasal spray USE 1 SPRAY IN EACH NOSTRIL TWICE A DAY NASALLY 30 DAY 09/24/17   [provider]  b complex vitamins capsule Take 1 capsule by mouth daily.    [provider]  BOTOX 100 units SOLR injection every 3 (three) months. For migraines 06/02/19   [provider]  Cholecalciferol (VITAMIN D3) 5000 units TABS Take 1 capsule by mouth once. 2,500 units daily    [provider]  EPINEPHrine 0.3 mg/0.3 mL IJ SOAJ injection AS DIRECTED INJECTION 30 03/31/19   [provider]  fluconazole (DIFLUCAN) 200 MG tablet Take 1 tablet Mon, Wed, Fri Patient not taking: Reported on 02/09/2021 02/02/21   Ralene Bathe, MD  Fluticasone-Umeclidin-Vilant (TRELEGY ELLIPTA) 200-62.5-25 MCG/INH AEPB 1 puff 07/28/20   [provider]  Galcanezumab-gnlm 120 MG/ML SOAJ Inject into the skin. One injection monthly    [provider]  imipramine (TOFRANIL) 25 MG tablet Take 25 mg by mouth daily. 06/17/19   [provider]  levocetirizine (XYZAL) 5 MG tablet Take 5 mg by mouth every evening.    [provider]  Magnesium 250 MG TABS Take by mouth. 1 tablet daily    [provider]  Multiple Vitamin (MULTIVITAMIN) tablet Take 1 tablet by mouth daily.    [provider]  omeprazole (PRILOSEC) 20 MG capsule Take 20 mg by mouth daily as needed.    [provider]  POTASSIUM CHLORIDE PO Take 1 tablet by mouth once. 550 mg daily    [provider]  TURMERIC CURCUMIN PO Take by mouth daily.    [provider]  UNABLE TO FIND Med Name: progesterone SR/DHEA 75/7.'5mg'$  nightly except Sundays    [provider]  valACYclovir (VALTREX) 500 MG tablet TAKE 1 TABLET (500 MG TOTAL) BY MOUTH 2 (TWO) TIMES DAILY FOR 7 DAYS. AS NEEDED FOR FLARES 03/20/21   Ralene Bathe, MD  vitamin E 1000 UNIT capsule Take 400 Units by mouth daily.     [provider]    Family History Family History  Problem Relation Age of Onset   Dementia Mother    Colon cancer Father    Ovarian cancer Maternal Aunt 60   Breast cancer Neg Hx     Social History Social History   Tobacco Use   Smoking status: Never   Smokeless tobacco: Never  Vaping Use   Vaping Use: Never used  Substance Use Topics   Alcohol use: Yes    Alcohol/week: 2.0 - 4.0 standard drinks of alcohol    Types: 2 - 4 Standard drinks or equivalent per week    Comment: weekends   Drug use: Never     Allergies   Patient has no known allergies.   Review of Systems Review of Systems Pertinent negatives listed in HPI   Physical Exam Triage Vital Signs ED Triage Vitals  Enc Vitals Group     BP 07/06/21 1604 112/80     Pulse Rate 07/06/21 1604 77     Resp 07/06/21 1604 18     Temp  07/06/21 1604 98.4 F (36.9 C)     Temp Source 07/06/21 1604 Oral     SpO2 07/06/21 1604 96 %     Weight --      Height --      Head Circumference --  Peak Flow --      Pain Score 07/06/21 1603 3     Pain Loc --      Pain Edu? --      Excl. in [place]? --    No data found.  Updated Vital Signs BP 112/80 (BP Location: Left Arm)   Pulse 77   Temp 98.4 F (36.9 C) (Oral)   Resp 18   LMP 04/06/2014   SpO2 96%   Visual Acuity Right Eye Distance:   Left Eye Distance:   Bilateral Distance:    Right Eye Near:   Left Eye Near:    Bilateral Near:     Physical Exam  General Appearance:    Alert, cooperative, no distress  HENT:   Normocephalic, ears normal, nares mucosal edema with congestion, rhinorrhea, oropharynx    Eyes:    PERRL, conjunctiva/corneas clear, EOM's intact       Lungs:     Clear to auscultation bilaterally, respirations unlabored  Heart:    Regular rate and rhythm  Neurologic:   Awake, alert, oriented x 3. No apparent focal neurological           defect.      UC Treatments / Results  Labs (all labs ordered are listed, but only abnormal results are displayed) Labs Reviewed - No data to display  EKG   Radiology No results found.  Procedures Procedures (including critical care time)  Medications Ordered in UC Medications - No data to display  Initial Impression / Assessment and Plan / UC Course  I have reviewed the triage vital signs and the nursing notes.  Pertinent labs & imaging results that were available during my care of the patient were reviewed by me and considered in my medical decision making (see chart for details).    Acute Non-Recurrent Pansinusitis  Treatment per discharge medication orders. Hydrate well with fluids. Complete medication as prescribed.  Follow-up with PCP or return to UC if symptoms worsen or do not improve.  Final Clinical Impressions(s) / UC Diagnoses   Final diagnoses:  Acute non-recurrent pansinusitis    Discharge Instructions   None    ED Prescriptions     Medication Sig Dispense Auth. Provider   doxycycline (VIBRAMYCIN) 100 MG capsule Take 1 capsule (100 mg total) by mouth 2 (two) times daily. 20 capsule Scot Jun, FNP   predniSONE (DELTASONE) 10 MG tablet Take 1 tablet (10 mg total) by mouth daily with breakfast for 5 days. 5 tablet Scot Jun, FNP   promethazine-dextromethorphan (PROMETHAZINE-DM) 6.25-15 MG/5ML syrup Take 5 mLs by mouth 4 (four) times daily as needed for cough. 180 mL Scot Jun, FNP      PDMP not reviewed this encounter.   Scot Jun, FNP 07/08/21 1426

## 2021-08-10 ENCOUNTER — Ambulatory Visit (INDEPENDENT_AMBULATORY_CARE_PROVIDER_SITE_OTHER): Payer: 59 | Admitting: Dermatology

## 2021-08-10 DIAGNOSIS — L578 Other skin changes due to chronic exposure to nonionizing radiation: Secondary | ICD-10-CM | POA: Diagnosis not present

## 2021-08-10 DIAGNOSIS — L304 Erythema intertrigo: Secondary | ICD-10-CM

## 2021-08-10 DIAGNOSIS — L719 Rosacea, unspecified: Secondary | ICD-10-CM | POA: Diagnosis not present

## 2021-08-10 DIAGNOSIS — L814 Other melanin hyperpigmentation: Secondary | ICD-10-CM

## 2021-08-10 DIAGNOSIS — Z86018 Personal history of other benign neoplasm: Secondary | ICD-10-CM

## 2021-08-10 DIAGNOSIS — L820 Inflamed seborrheic keratosis: Secondary | ICD-10-CM | POA: Diagnosis not present

## 2021-08-10 DIAGNOSIS — D229 Melanocytic nevi, unspecified: Secondary | ICD-10-CM

## 2021-08-10 DIAGNOSIS — Z1283 Encounter for screening for malignant neoplasm of skin: Secondary | ICD-10-CM

## 2021-08-10 DIAGNOSIS — D1800 Hemangioma unspecified site: Secondary | ICD-10-CM

## 2021-08-10 DIAGNOSIS — L821 Other seborrheic keratosis: Secondary | ICD-10-CM

## 2021-08-10 DIAGNOSIS — Z85828 Personal history of other malignant neoplasm of skin: Secondary | ICD-10-CM

## 2021-08-10 MED ORDER — HYDROCORTISONE 2.5 % EX CREA: TOPICAL_CREAM | Freq: Two times a day (BID) | CUTANEOUS | 11 refills | Status: AC | PRN

## 2021-08-10 MED ORDER — IVERMECTIN 1 % EX CREA: 1.0000 | TOPICAL_CREAM | Freq: Every day | CUTANEOUS | 11 refills | Status: AC

## 2021-08-10 MED ORDER — FLUCONAZOLE 200 MG PO TABS: ORAL_TABLET | ORAL | 2 refills | Status: DC

## 2021-08-10 NOTE — Progress Notes (Signed)
Follow-Up Visit   Subjective  Michelle Mccarthy is a 61 y.o. female who presents for the following: Total body skin exam (Hx of BCC L deltoid,  Dysplastic Nevi), Rosacea (Face, SM Triple cream 3-4x/wk), and Intertrigo (Inframammary, SM Triple cream bid, oral fluconazole in past). The patient presents for Total-Body Skin Exam (TBSE) for skin cancer screening and mole check.  The patient has spots, moles and lesions to be evaluated, some may be new or changing and the patient has concerns that these could be cancer.  The following portions of the chart were reviewed this encounter and updated as appropriate:   Tobacco  Allergies  Meds  Problems  Med Hx  Surg Hx  Fam Hx     Review of Systems:  No other skin or systemic complaints except as noted in HPI or Assessment and Plan.  Objective  Well appearing patient in no apparent distress; mood and affect are within normal limits.  A full examination was performed including scalp, head, eyes, ears, nose, lips, neck, chest, axillae, abdomen, back, buttocks, bilateral upper extremities, bilateral lower extremities, hands, feet, fingers, toes, fingernails, and toenails. All findings within normal limits unless otherwise noted below.  inframammary Erythema and maceration inframammary  face Erythema face  face, arms Brown macules face, arms  R arm x 1 Stuck on waxy paps with erythema   Assessment & Plan   Seborrheic Keratoses - Stuck-on, waxy, tan-brown papules and/or plaques  - Benign-appearing - Discussed benign etiology and prognosis. - Observe - Call for any changes  Melanocytic Nevi - Tan-brown and/or pink-flesh-colored symmetric macules and papules - Benign appearing on exam today - Observation - Call clinic for new or changing moles - Recommend daily use of broad spectrum spf 30+ sunscreen to sun-exposed areas.   Hemangiomas - Red papules - Discussed benign nature - Observe - Call for any changes  Actinic Damage -  Chronic condition, secondary to cumulative UV/sun exposure - diffuse scaly erythematous macules with underlying dyspigmentation - Recommend daily broad spectrum sunscreen SPF 30+ to sun-exposed areas, reapply every 2 hours as needed.  - Staying in the shade or wearing long sleeves, sun glasses (UVA+UVB protection) and wide brim hats (4-inch brim around the entire circumference of the hat) are also recommended for sun protection.  - Call for new or changing lesions.  Skin cancer screening performed today.   History of Basal Cell Carcinoma of the Skin - No evidence of recurrence today - Recommend regular full body skin exams - Recommend daily broad spectrum sunscreen SPF 30+ to sun-exposed areas, reapply every 2 hours as needed.  - Call if any new or changing lesions are noted between office visits  - L deltoid  History of Dysplastic Nevi - No evidence of recurrence today - Recommend regular full body skin exams - Recommend daily broad spectrum sunscreen SPF 30+ to sun-exposed areas, reapply every 2 hours as needed.  - Call if any new or changing lesions are noted between office visits  - multiple   Intertrigo inframammary Chronic and persistent condition with duration or expected duration over one year. Condition is bothersome/symptomatic for patient. Currently flared.  Intertrigo is a chronic recurrent rash that occurs in skin fold areas that may be associated with friction; heat; moisture; yeast; fungus; and bacteria.  It is exacerbated by increased movement / activity; sweating; and higher atmospheric temperature.   Cont SM Iodoquinol 1% / Hydrocortisone 2.5% / Niacinamide 2% Cream qd to bid aa rash until clear, then prn  flares  Start Fluconazole '200mg'$  1 po 3 times a week for 1 week prn flares #3, 2rf  Discussed Domeboros soaks 2-3x/wk Discussed Zeasorb AF when rash calm to help prevent/reduce flares  Side effects of fluconazole (diflucan) include nausea, diarrhea, headache,  dizziness, taste changes, rare risk of irritation of the liver, allergy, or decreased blood counts (which could show up as infection or tiredness).   hydrocortisone 2.5 % cream - inframammary Apply topically 2 (two) times daily as needed (Rash). Qd to bid aa rash under breast until clear, then prn flares  Related Medications fluconazole (DIFLUCAN) 200 MG tablet Take 1 tablet Mon, Wed, Fri for 1 week prn flares  Rosacea face Rosacea is a chronic progressive skin condition usually affecting the face of adults, causing redness and/or acne bumps. It is treatable but not curable. It sometimes affects the eyes (ocular rosacea) as well. It may respond to topical and/or systemic medication and can flare with stress, sun exposure, alcohol, exercise and some foods.  Daily application of broad spectrum spf 30+ sunscreen to face is recommended to reduce flares.  Cont SM Azelaic Acid 15% / Metronidazole 1% / Ivermectin 1% Cream qhs to face Apply 1 Application topically at bedtime.  Lentigines face, arms Discussed the treatment option of BBL/laser.  Typically we recommend 1-3 treatment sessions about 5-8 weeks apart for best results.  The patient's condition may require "maintenance treatments" in the future.  The fee for BBL / laser treatments is $350 per treatment session for the whole face.  A fee can be quoted for other parts of the body. Insurance typically does not pay for BBL/laser treatments and therefore the fee is an out-of-pocket cost.  Inflamed seborrheic keratosis R arm x 1 Symptomatic, irritating, patient would like treated. Destruction of lesion - R arm x 1 Complexity: simple   Destruction method: cryotherapy   Informed consent: discussed and consent obtained   Timeout:  patient name, date of birth, surgical site, and procedure verified Lesion destroyed using liquid nitrogen: Yes   Region frozen until ice ball extended beyond lesion: Yes   Outcome: patient tolerated procedure well  with no complications   Post-procedure details: wound care instructions given    Return in about 6 months (around 02/10/2022) for intertrigo f/u, Discuss BBL for Lentigos, 1 yr TBSE , Hx of BCC, Hx of Dysplastic nevi.  I, Othelia Pulling, RMA, am acting as scribe for Sarina Ser, MD . Documentation: I have reviewed the above documentation for accuracy and completeness, and I agree with the above.  Sarina Ser, MD

## 2021-08-10 NOTE — Patient Instructions (Addendum)
Cryotherapy Aftercare  Wash gently with soap and water everyday.   Apply Vaseline and Band-Aid daily until healed.     Instructions for [facility] Medications  One or more of your medications was sent to the [facility] mail order compounding pharmacy. You will receive an email from them and can purchase the medicine through that link. It will then be mailed to your home at the address you confirmed. If for any reason you do not receive an email from them, please check your spam folder. If you still do not find the email, please let us know. [facility] phone number is [phone].    Domeboros soaks a few times a week to rash under breast Recommend Zeasorb AF powder when rash under breast calm to help reduce flares   Due to recent changes in healthcare laws, you may see results of your pathology and/or laboratory studies on MyChart before the doctors have had a chance to review them. We understand that in some cases there may be results that are confusing or concerning to you. Please understand that not all results are received at the same time and often the doctors may need to interpret multiple results in order to provide you with the best plan of care or course of treatment. Therefore, we ask that you please give [place] 2 business days to thoroughly review all your results before contacting the office for clarification. Should we see a critical lab result, you will be contacted sooner.   If You Need Anything After Your Visit  If you have any questions or concerns for your doctor, please call our main line at [phone] and press option 4 to reach your doctor's medical assistant. If no one answers, please leave a voicemail as directed and we will return your call as soon as possible. Messages left after 4 pm will be answered the following business day.   You may also send [place] a message via [place]. We typically respond to MyChart messages within 1-2 business days.  For  prescription refills, please ask your pharmacy to contact our office. Our fax number is [phone].  If you have an urgent issue when the clinic is closed that cannot wait until the next business day, you can page your doctor at the number below.    Please note that while we do our best to be available for urgent issues outside of office hours, we are not available 24/7.   If you have an urgent issue and are unable to reach [place], you may choose to seek medical care at your doctor's office, retail clinic, urgent care center, or emergency room.  If you have a medical emergency, please immediately call 911 or go to the emergency department.  Pager Numbers  - Dr. Nehemiah Massed: [phone]  - Dr. Laurence Ferrari: [phone]  - Dr. Nicole Kindred: [phone]  In the event of inclement weather, please call our main line at [phone] for an update on the status of any delays or closures.  Dermatology Medication Tips: Please keep the boxes that topical medications come in in order to help keep track of the instructions about where and how to use these. Pharmacies typically print the medication instructions only on the boxes and not directly on the medication tubes.   If your medication is too expensive, please contact our office at [phone] option 4 or send [place] a message through [place].   We are unable to tell what your co-pay for medications will be in advance as this is different  depending on your insurance coverage. However, we may be able to find a substitute medication at lower cost or fill out paperwork to get insurance to cover a needed medication.   If a prior authorization is required to get your medication covered by your insurance company, please allow [place] 1-2 business days to complete this process.  Drug prices often vary depending on where the prescription is filled and some pharmacies may offer cheaper prices.  The website www.goodrx.com contains coupons for medications through different  pharmacies. The prices here do not account for what the cost may be with help from insurance (it may be cheaper with your insurance), but the website can give you the price if you did not use any insurance.  - You can print the associated coupon and take it with your prescription to the pharmacy.  - You may also stop by our office during regular business hours and pick up a GoodRx coupon card.  - If you need your prescription sent electronically to a different pharmacy, notify our office through [facility] or by phone at [phone] option 4.     Si Usted Necesita Algo Despus de Su Visita  Tambin puede enviarnos un mensaje a travs de Pharmacist, community. Por lo general respondemos a los mensajes de MyChart en el transcurso de 1 a 2 das hbiles.  Para renovar recetas, por favor pida a su farmacia que se ponga en contacto con nuestra oficina. Harland Dingwall de fax es [place] [phone].  Si tiene un asunto urgente cuando la clnica est cerrada y que no puede esperar hasta el siguiente da hbil, puede llamar/localizar a su doctor(a) al nmero que aparece a continuacin.   Por favor, tenga en cuenta que aunque hacemos todo lo posible para estar disponibles para asuntos urgentes fuera del horario de [place], no estamos disponibles las 24 horas del da, los 7 das de la [place].   Si tiene un problema urgente y no puede comunicarse con nosotros, puede optar por buscar atencin mdica  en el consultorio de su doctor(a), en una clnica privada, en un centro de atencin urgente o en una sala de emergencias.  Si tiene Engineering geologist, por favor llame inmediatamente al 911 o vaya a la sala de emergencias.  Nmeros de bper  - Dr. Nehemiah Massed: [phone]  - Dra. Moye: [phone]  - Dra. Nicole Kindred: [phone]  En caso de inclemencias del [place], por favor llame a Johnsie Kindred principal al [phone] para una actualizacin sobre el [place] de cualquier retraso o cierre.  Consejos para la  medicacin en dermatologa: Por favor, guarde las cajas en las que vienen los medicamentos de uso tpico para ayudarle a seguir las instrucciones sobre dnde y cmo usarlos. Las farmacias generalmente imprimen las instrucciones del medicamento slo en las cajas y no directamente en los tubos del [place].   Si su medicamento es muy caro, por favor, pngase en contacto con Zigmund Daniel llamando al [phone] y presione la opcin 4 o envenos un mensaje a travs de Pharmacist, community.   No podemos decirle cul ser su copago por los medicamentos por adelantado ya que esto es diferente dependiendo de la cobertura de su seguro. Sin embargo, es posible que podamos encontrar un medicamento sustituto a Electrical engineer un formulario para que el seguro cubra el medicamento que se considera necesario.   Si se requiere una autorizacin previa para que su compaa de seguros [place] su medicamento, por favor permtanos de 1 a 2 das hbiles para [employer]  proceso.  Los precios de los medicamentos varan con frecuencia dependiendo del Environmental consultant de dnde se surte la receta y alguna farmacias pueden ofrecer precios ms baratos.  El sitio web www.goodrx.com tiene cupones para medicamentos de Airline pilot. Los precios aqu no tienen en cuenta lo que podra costar con la ayuda del seguro (puede ser ms barato con su seguro), pero el sitio web puede darle el precio si no utiliz Research scientist (physical sciences).  - Puede imprimir el cupn correspondiente y llevarlo con su receta a la farmacia.  - Tambin puede pasar por nuestra oficina durante el horario de atencin regular y Charity fundraiser una tarjeta de cupones de GoodRx.  - Si necesita que su receta se enve electrnicamente a una farmacia diferente, informe a nuestra oficina a travs de MyChart de [place] o por telfono llamando al [phone] y presione la opcin 4.

## 2021-08-12 ENCOUNTER — Encounter: Payer: Self-pay | Admitting: Dermatology

## 2021-09-17 ENCOUNTER — Other Ambulatory Visit: Payer: Self-pay | Admitting: Dermatology

## 2021-09-17 DIAGNOSIS — B009 Herpesviral infection, unspecified: Secondary | ICD-10-CM

## 2021-10-24 ENCOUNTER — Ambulatory Visit (INDEPENDENT_AMBULATORY_CARE_PROVIDER_SITE_OTHER): Payer: 59 | Admitting: Internal Medicine

## 2021-10-24 ENCOUNTER — Encounter: Payer: Self-pay | Admitting: Internal Medicine

## 2021-10-24 VITALS — BP 120/72 | HR 86 | Temp 98.1°F | Ht 68.0 in | Wt 170.4 lb

## 2021-10-24 DIAGNOSIS — Z79899 Other long term (current) drug therapy: Secondary | ICD-10-CM | POA: Diagnosis not present

## 2021-10-24 DIAGNOSIS — Z6825 Body mass index (BMI) 25.0-25.9, adult: Secondary | ICD-10-CM

## 2021-10-24 DIAGNOSIS — N951 Menopausal and female climacteric states: Secondary | ICD-10-CM

## 2021-10-24 DIAGNOSIS — Z23 Encounter for immunization: Secondary | ICD-10-CM

## 2021-10-24 NOTE — Patient Instructions (Signed)
The 10-year ASCVD risk score (Arnett DK, et al., 2019) is: 3.1%   Values used to calculate the score:     Age: 61 years     Sex: Female     Is Non-Hispanic African American: No     Diabetic: No     Tobacco smoker: No     Systolic Blood Pressure: 906 mmHg     Is BP treated: No     HDL Cholesterol: 98 mg/dL     Total Cholesterol: 266 mg/dL

## 2021-10-24 NOTE — Progress Notes (Signed)
[name],acting as a Education administrator for [name], MD.,have documented all relevant documentation on the behalf of [name], MD,as directed by  [name], MD while in the presence of [name], MD.    Subjective:     Patient ID: Michelle Mccarthy , female    DOB: [birth year] , 61 y.o.   MRN: [id number]   Chief Complaint  Patient presents with   BHRT f/u    HPI  She presents today for BHRT f/u. She has no other concerns or complaints at this time. She is now taking progesterone SR/DHEA 50/5 nightly, except Sundays. She feels well on this regimen. She will start the next dose progesterone SR/DHEA 25/2.'5mg'$  soon.      Past Medical History:  Diagnosis Date   Arthritis    back   Asthma    Atypical mole 11/27/2017   L mid side/mod   Atypical mole 12/18/2018   L mid to upper back/mod, L lateral buttock/mod   Family history of ovarian cancer    9/20 genetic testing letter sent   Hx of basal cell carcinoma 09/18/2016   multiple sites    Hx of dysplastic nevus 06/11/2017   L ant waistline lateral/excision   Hx of migraines    Insomnia    Menopausal and female climacteric states    Migraine headache    2-3/week   Motion sickness    cars - back seat, circular motion   Postmenopausal atrophic vaginitis      Family History  Problem Relation Age of Onset   Dementia Mother    Colon cancer Father    Ovarian cancer Maternal Aunt 60   Breast cancer Neg Hx      Current Outpatient Medications:    albuterol (PROVENTIL HFA;VENTOLIN HFA) 108 (90 Base) MCG/ACT inhaler, , Disp: , Rfl:    azelastine (ASTELIN) 0.1 % nasal spray, USE 1 SPRAY IN EACH NOSTRIL TWICE A DAY NASALLY 30 DAY, Disp: , Rfl: 5   b complex vitamins capsule, Take 1 capsule by mouth daily., Disp: , Rfl:    BOTOX 100 units SOLR injection, every 3 (three) months. For migraines, Disp: , Rfl:    Cholecalciferol (VITAMIN D3) 5000 units TABS, Take 1 capsule by mouth once. 2,500 units daily, Disp: ,  Rfl:    EPINEPHrine 0.3 mg/0.3 mL IJ SOAJ injection, AS DIRECTED INJECTION 30, Disp: , Rfl:    Fluticasone-Umeclidin-Vilant (TRELEGY ELLIPTA) 200-62.5-25 MCG/INH AEPB, 1 puff, Disp: , Rfl:    Galcanezumab-gnlm 120 MG/ML SOAJ, Inject into the skin. One injection monthly, Disp: , Rfl:    hydrocortisone 2.5 % cream, Apply topically 2 (two) times daily as needed (Rash). Qd to bid aa rash under breast until clear, then prn flares, Disp: 30 g, Rfl: 11   imipramine (TOFRANIL) 25 MG tablet, Take 25 mg by mouth daily., Disp: , Rfl:    Ivermectin 1 % CREA, Apply 1 Application topically at bedtime., Disp: 30 g, Rfl: 11   levocetirizine (XYZAL) 5 MG tablet, Take 5 mg by mouth every evening., Disp: , Rfl:    Multiple Vitamin (MULTIVITAMIN) tablet, Take 1 tablet by mouth daily., Disp: , Rfl:    POTASSIUM CHLORIDE PO, Take 1 tablet by mouth once. 550 mg daily, Disp: , Rfl:    TURMERIC CURCUMIN PO, Take by mouth daily., Disp: , Rfl:    UNABLE TO FIND, Med Name: progesterone SR/DHEA 75/7.'5mg'$  nightly except Sundays, Disp: , Rfl:    valACYclovir (VALTREX) 500 MG  tablet, TAKE 1 TABLET (500 MG TOTAL) BY MOUTH 2 (TWO) TIMES DAILY FOR 7 DAYS. AS NEEDED FOR FLARES, Disp: 180 tablet, Rfl: 1   vitamin E 1000 UNIT capsule, Take 400 Units by mouth daily. , Disp: , Rfl:    eletriptan (RELPAX) 40 MG tablet, Take 40 mg by mouth as needed., Disp: , Rfl:    No Known Allergies   Review of Systems  Constitutional: Negative.   Respiratory: Negative.    Cardiovascular: Negative.   Gastrointestinal: Negative.   Neurological: Negative.   Psychiatric/Behavioral: Negative.       Today's Vitals   10/24/21 1602  BP: 120/72  Pulse: 86  Temp: 98.1 F (36.7 C)  Weight: 170 lb 6.4 oz (77.3 kg)  Height: '5\' 8"'$  (1.727 m)  PainSc: 0-No pain   Body mass index is 25.91 kg/m.  Wt Readings from Last 3 Encounters:  10/24/21 170 lb 6.4 oz (77.3 kg)  06/20/21 171 lb 6.4 oz (77.7 kg)  02/09/21 182 lb 9.6 oz (82.8 kg)      Objective:  Physical Exam Vitals and nursing note reviewed.  Constitutional:      Appearance: Normal appearance.  HENT:     Head: Normocephalic and atraumatic.     Nose:     Comments: Masked     Mouth/Throat:     Comments: Masked  Eyes:     Extraocular Movements: Extraocular movements intact.  Cardiovascular:     Rate and Rhythm: Normal rate and regular rhythm.     Heart sounds: Normal heart sounds.  Pulmonary:     Effort: Pulmonary effort is normal.     Breath sounds: Normal breath sounds.  Musculoskeletal:     Cervical back: Normal range of motion.  Skin:    General: Skin is warm.  Neurological:     General: No focal deficit present.     Mental Status: She is alert.  Psychiatric:        Mood and Affect: Mood normal.        Behavior: Behavior normal.       Assessment And Plan:     1. Female climacteric state Comments: Again, she will start progesterone/DHEA 25/2.'5mg'$  nightly except Sundays in the near future. She will f/u in 4 months.   2. BMI 25.0-25.9,adult Comments: She is encouraged to aim for at least 150 minutes of exercise per week.   3. Immunization due Comments: She was given her second Shingrix vaccine today.  - Varicella-zoster vaccine IM  4. Drug therapy - Liver Profile   Patient was given opportunity to ask questions. Patient verbalized understanding of the plan and was able to repeat key elements of the plan. All questions were answered to their satisfaction.   I, [name], MD, have reviewed all documentation for this visit. The documentation on 10/24/21 for the exam, diagnosis, procedures, and orders are all accurate and complete.   IF YOU HAVE BEEN REFERRED TO A SPECIALIST, IT MAY TAKE 1-2 WEEKS TO SCHEDULE/PROCESS THE REFERRAL. IF YOU HAVE NOT HEARD FROM US/SPECIALIST IN TWO WEEKS, PLEASE GIVE [place] A CALL AT [phone].   THE PATIENT IS ENCOURAGED TO PRACTICE SOCIAL DISTANCING DUE TO THE COVID-19 PANDEMIC.

## 2021-10-25 LAB — HEPATIC FUNCTION PANEL
ALT: 19 IU/L (ref 0–32)
AST: 22 IU/L (ref 0–40)
Albumin: 4.4 g/dL (ref 3.9–4.9)
Alkaline Phosphatase: 78 IU/L (ref 44–121)
Bilirubin Total: <0.2 mg/dL (ref 0.0–1.2)
Bilirubin, Direct: <0.1 mg/dL (ref 0.00–0.40)
Total Protein: 6.7 g/dL (ref 6.0–8.5)

## 2021-12-27 ENCOUNTER — Telehealth (INDEPENDENT_AMBULATORY_CARE_PROVIDER_SITE_OTHER): Payer: 59 | Admitting: Internal Medicine

## 2021-12-27 ENCOUNTER — Encounter: Payer: Self-pay | Admitting: Internal Medicine

## 2021-12-27 DIAGNOSIS — U071 COVID-19: Secondary | ICD-10-CM

## 2021-12-27 MED ORDER — MOLNUPIRAVIR EUA 200MG CAPSULE: 4.0000 | ORAL_CAPSULE | Freq: Two times a day (BID) | ORAL | 0 refills | Status: AC

## 2021-12-27 NOTE — Patient Instructions (Signed)

## 2021-12-27 NOTE — Progress Notes (Signed)
Virtual Visit via Video   This visit type was conducted due to national recommendations for restrictions regarding the COVID-19 Pandemic (e.g. social distancing) in an effort to limit this patient's exposure and mitigate transmission in our community.  Due to her co-morbid illnesses, this patient is at least at moderate risk for complications without adequate follow up.  This format is felt to be most appropriate for this patient at this time.  All issues noted in this document were discussed and addressed.  A limited physical exam was performed with this format.    This visit type was conducted due to national recommendations for restrictions regarding the COVID-19 Pandemic (e.g. social distancing) in an effort to limit this patient's exposure and mitigate transmission in our community.  Patients identity confirmed using two different identifiers.  This format is felt to be most appropriate for this patient at this time.  All issues noted in this document were discussed and addressed.  No physical exam was performed (except for noted visual exam findings with Video Visits).    Date:  12/27/2021   ID:  Michelle Mccarthy, DOB Dec 04, 1960, MRN [id number]  Patient Location:  Home  Provider location:   Office    Chief Complaint:  "I have COVID"  History of Present Illness:    Michelle Mccarthy is a 61 y.o. female who presents via video conferencing for a telehealth visit today.    The patient does have symptoms concerning for COVID-19 infection (fever, chills, cough, or new shortness of breath).   She presents today for virtual visit. She prefers this method of contact due to COVID-19 pandemic. She presents today for treatment for COVID. She states she was exposed last Wed night. She went to a holiday dinner w/ coworkers last Wed and now 8 people have tested pos for COVID. She developed sx yesterday - headache, sneezing and sinus drainage. Last night she developed a cough, which is unusual for her.  Last night,she took a rapid COVID test which was negative. She tested positive this morning. She is interested in treatment. She does have fever (not documented) and chills. She also has body aches, states she feels pretty bad today.        Past Medical History:  Diagnosis Date   Arthritis    back   Asthma    Atypical mole 11/27/2017   L mid side/mod   Atypical mole 12/18/2018   L mid to upper back/mod, L lateral buttock/mod   Family history of ovarian cancer    9/20 genetic testing letter sent   Hx of basal cell carcinoma 09/18/2016   multiple sites    Hx of dysplastic nevus 06/11/2017   L ant waistline lateral/excision   Hx of migraines    Insomnia    Menopausal and female climacteric states    Migraine headache    2-3/week   Motion sickness    cars - back seat, circular motion   Postmenopausal atrophic vaginitis    Past Surgical History:  Procedure Laterality Date   BACK SURGERY  2016   Microdiskectomy   COLONOSCOPY WITH PROPOFOL N/A 09/25/2019   Procedure: COLONOSCOPY WITH PROPOFOL;  Surgeon: [name], MD;  Location: [facility];  Service: Endoscopy;  Laterality: N/A;  priority 4   LUMBAR FUSION  04/20/2020   Dr. Weston Settle   POLYPECTOMY  09/25/2019   Procedure: POLYPECTOMY;  Surgeon: [name], MD;  Location: [facility];  Service: Endoscopy;;     Current Meds  Medication  Sig   albuterol (PROVENTIL HFA;VENTOLIN HFA) 108 (90 Base) MCG/ACT inhaler    azelastine (ASTELIN) 0.1 % nasal spray USE 1 SPRAY IN EACH NOSTRIL TWICE A DAY NASALLY 30 DAY   b complex vitamins capsule Take 1 capsule by mouth daily.   BOTOX 100 units SOLR injection every 3 (three) months. For migraines   Cholecalciferol (VITAMIN D3) 5000 units TABS Take 1 capsule by mouth once. 2,500 units daily   eletriptan (RELPAX) 40 MG tablet Take 40 mg by mouth as needed.   EPINEPHrine 0.3 mg/0.3 mL IJ SOAJ injection AS DIRECTED INJECTION 30   Fluticasone-Umeclidin-Vilant (TRELEGY ELLIPTA)  200-62.5-25 MCG/INH AEPB 1 puff   Galcanezumab-gnlm 120 MG/ML SOAJ Inject into the skin. One injection monthly   hydrocortisone 2.5 % cream Apply topically 2 (two) times daily as needed (Rash). Qd to bid aa rash under breast until clear, then prn flares   imipramine (TOFRANIL) 25 MG tablet Take 25 mg by mouth daily.   Ivermectin 1 % CREA Apply 1 Application topically at bedtime.   levocetirizine (XYZAL) 5 MG tablet Take 5 mg by mouth every evening.   Multiple Vitamin (MULTIVITAMIN) tablet Take 1 tablet by mouth daily.   POTASSIUM CHLORIDE PO Take 1 tablet by mouth once. 550 mg daily   TURMERIC CURCUMIN PO Take by mouth daily.   valACYclovir (VALTREX) 500 MG tablet TAKE 1 TABLET (500 MG TOTAL) BY MOUTH 2 (TWO) TIMES DAILY FOR 7 DAYS. AS NEEDED FOR FLARES   vitamin E 1000 UNIT capsule Take 400 Units by mouth daily.      Allergies:   Patient has no known allergies.   Social History   Tobacco Use   Smoking status: Never   Smokeless tobacco: Never  Vaping Use   Vaping Use: Never used  Substance Use Topics   Alcohol use: Yes    Alcohol/week: 2.0 - 4.0 standard drinks of alcohol    Types: 2 - 4 Standard drinks or equivalent per week    Comment: weekends   Drug use: Never     Family Hx: The patient's family history includes Colon cancer in her father; Dementia in her mother; Ovarian cancer (age of onset: 51) in her maternal aunt. There is no history of Breast cancer.  ROS:   Please see the history of present illness.    Review of Systems  Constitutional:  Positive for chills, fever and malaise/fatigue.  HENT:  Positive for congestion.   Respiratory:  Positive for cough.   Cardiovascular: Negative.   Gastrointestinal: Negative.   Neurological: Negative.   Psychiatric/Behavioral: Negative.      All other systems reviewed and are negative.   Labs/Other Tests and Data Reviewed:    Recent Labs: 02/09/2021: BUN 11; Creatinine, Ser 0.73; Hemoglobin 14.0; Platelets 348; Potassium 4.7;  Sodium 141; TSH 2.440 10/24/2021: ALT 19   Recent Lipid Panel Lab Results  Component Value Date/Time   CHOL 266 (H) 02/09/2021 10:20 AM   TRIG 76 02/09/2021 10:20 AM   HDL 98 02/09/2021 10:20 AM   CHOLHDL 2.7 02/09/2021 10:20 AM   LDLCALC 156 (H) 02/09/2021 10:20 AM    Wt Readings from Last 3 Encounters:  10/24/21 170 lb 6.4 oz (77.3 kg)  06/20/21 171 lb 6.4 oz (77.7 kg)  02/09/21 182 lb 9.6 oz (82.8 kg)     Exam:    Vital Signs:  LMP 04/06/2014     Physical Exam Vitals and nursing note reviewed.  Constitutional:      Appearance: She is  ill-appearing.  HENT:     Head: Normocephalic and atraumatic.     Comments: Sounds congested Eyes:     Extraocular Movements: Extraocular movements intact.  Pulmonary:     Effort: Pulmonary effort is normal.  Musculoskeletal:     Cervical back: Normal range of motion.  Neurological:     Mental Status: She is alert and oriented to person, place, and time.  Psychiatric:        Mood and Affect: Affect normal.        Thought Content: Thought content normal.     ASSESSMENT & PLAN:     1. COVID-19  I will refer her for home monitoring/temperature monitoring program. She is encouraged to email me daily on mychart to let me know how she is doing. She is encouraged to go to ER should she develop worsening SOB. Pt advised that she will be out of work for at least one week. She verbalizes understanding of her treatment plan. All questions were answered to her satisfaction. She understands that she needs to continue to self quarantine. I do not have recent GFR on file, so I will send rx Lagevrio to her pharmacy.  Advised patient to take Vitamin C, D, Zinc.  Keep yourself hydrated with a lot of water and rest. Take Delsym for cough and Mucinex as needed. Take Tylenol or pain reliever every 4-6 hours as needed for pain/fever/body ache. If you have elevated blood pressure, you can take OTC Coricidin as needed . You can also take OTC oscillococcinum,  a homeopathic remedy, to help with your symptoms.  Educated patient if symptoms get worse or if she experiences any SOB, chest pain or pain in her legs to seek immediate emergency care. Continue to monitor your oxygen levels. Call us if you have any questions. Quarantine for 5 days if tested positive and no symptoms or 10 days if tested positive and have symptoms. Wear a mask around other people.    COVID-19 Education: The signs and symptoms of COVID-19 were discussed with the patient and how to seek care for testing (follow up with PCP or arrange E-visit).  The importance of social distancing was discussed today.  Patient Risk:   After full review of this patients clinical status, I feel that they are at least moderate risk at this time.  Time:   Today, I have spent 10 minutes/ seconds with the patient with telehealth technology discussing above diagnoses.     Medication Adjustments/Labs and Tests Ordered: Current medicines are reviewed at length with the patient today.  Concerns regarding medicines are outlined above.   Tests Ordered: No orders of the defined types were placed in this encounter.   Medication Changes: No orders of the defined types were placed in this encounter.   Disposition:  Follow up prn  Signed, [name], MD

## 2021-12-28 ENCOUNTER — Encounter: Payer: Self-pay | Admitting: Internal Medicine

## 2022-02-19 ENCOUNTER — Encounter: Payer: 59 | Admitting: Dermatology

## 2022-03-01 ENCOUNTER — Encounter: Payer: Self-pay | Admitting: Internal Medicine

## 2022-03-01 ENCOUNTER — Ambulatory Visit (INDEPENDENT_AMBULATORY_CARE_PROVIDER_SITE_OTHER): Payer: 59 | Admitting: Internal Medicine

## 2022-03-01 VITALS — BP 118/82 | HR 89 | Temp 98.4°F | Ht 68.0 in | Wt 167.6 lb

## 2022-03-01 DIAGNOSIS — L304 Erythema intertrigo: Secondary | ICD-10-CM

## 2022-03-01 DIAGNOSIS — Z6825 Body mass index (BMI) 25.0-25.9, adult: Secondary | ICD-10-CM | POA: Diagnosis not present

## 2022-03-01 DIAGNOSIS — Z0000 Encounter for general adult medical examination without abnormal findings: Secondary | ICD-10-CM | POA: Diagnosis not present

## 2022-03-01 NOTE — Progress Notes (Signed)
I,Michelle Mccarthy,acting as a scribe for Michelle Greenland, MD.,have documented all relevant documentation on the behalf of Michelle Greenland, MD,as directed by  Michelle Greenland, MD while in the presence of Michelle Greenland, MD.   Subjective:     Patient ID: Michelle Mccarthy , female    DOB: [birth year] , 62 y.o.   MRN: [id number]   Chief Complaint  Patient presents with   Annual Exam    HPI  Patient presents today for a physical. She is no longer followed by Dr.Harris for her GYN care. Patient states he moved to [place]. She does not want to complete PAP today. She is okay with referral to see new GYN provider. She has no new concerns.      Past Medical History:  Diagnosis Date   Arthritis    back   Asthma    Atypical mole 11/27/2017   L mid side/mod   Atypical mole 12/18/2018   L mid to upper back/mod, L lateral buttock/mod   Family history of ovarian cancer    9/20 genetic testing letter sent   Hx of basal cell carcinoma 09/18/2016   multiple sites    Hx of dysplastic nevus 06/11/2017   L ant waistline lateral/excision   Hx of migraines    Insomnia    Menopausal and female climacteric states    Migraine headache    2-3/week   Motion sickness    cars - back seat, circular motion   Postmenopausal atrophic vaginitis      Family History  Problem Relation Age of Onset   Dementia Mother    Colon cancer Father    Ovarian cancer Maternal Aunt 60   Breast cancer Neg Hx      Current Outpatient Medications:    albuterol (PROVENTIL HFA;VENTOLIN HFA) 108 (90 Base) MCG/ACT inhaler, , Disp: , Rfl:    azelastine (ASTELIN) 0.1 % nasal spray, USE 1 SPRAY IN EACH NOSTRIL TWICE A DAY NASALLY 30 DAY, Disp: , Rfl: 5   b complex vitamins capsule, Take 1 capsule by mouth daily., Disp: , Rfl:    BOTOX 100 units SOLR injection, every 3 (three) months. For migraines, Disp: , Rfl:    Cholecalciferol (VITAMIN D3) 5000 units TABS, Take 1 capsule by mouth once. 2,500 units daily, Disp: ,  Rfl:    eletriptan (RELPAX) 40 MG tablet, Take 40 mg by mouth as needed., Disp: , Rfl:    EPINEPHrine 0.3 mg/0.3 mL IJ SOAJ injection, AS DIRECTED INJECTION 30, Disp: , Rfl:    Fluticasone-Umeclidin-Vilant (TRELEGY ELLIPTA) 200-62.5-25 MCG/INH AEPB, 1 puff, Disp: , Rfl:    Galcanezumab-gnlm 120 MG/ML SOAJ, Inject into the skin. One injection monthly, Disp: , Rfl:    hydrocortisone 2.5 % cream, Apply topically 2 (two) times daily as needed (Rash). Qd to bid aa rash under breast until clear, then prn flares, Disp: 30 g, Rfl: 11   imipramine (TOFRANIL) 25 MG tablet, Take 25 mg by mouth daily., Disp: , Rfl:    Ivermectin 1 % CREA, Apply 1 Application topically at bedtime., Disp: 30 g, Rfl: 11   levocetirizine (XYZAL) 5 MG tablet, Take 5 mg by mouth every evening., Disp: , Rfl:    Multiple Vitamin (MULTIVITAMIN) tablet, Take 1 tablet by mouth daily., Disp: , Rfl:    POTASSIUM CHLORIDE PO, Take 1 tablet by mouth once. 550 mg daily, Disp: , Rfl:    TURMERIC CURCUMIN PO, Take by mouth daily., Disp: , Rfl:  valACYclovir (VALTREX) 500 MG tablet, TAKE 1 TABLET (500 MG TOTAL) BY MOUTH 2 (TWO) TIMES DAILY FOR 7 DAYS. AS NEEDED FOR FLARES, Disp: 180 tablet, Rfl: 1   vitamin E 1000 UNIT capsule, Take 400 Units by mouth daily. , Disp: , Rfl:    UNABLE TO FIND, Med Name: progesterone SR/DHEA 75/7.31m nightly except Sundays (Patient not taking: Reported on 12/27/2021), Disp: , Rfl:    No Known Allergies    The patient states she uses post menopausal status for birth control. Last LMP was Patient's last menstrual period was 04/06/2014.. Negative for Dysmenorrhea. Negative for: breast discharge, breast lump(s), breast pain and breast self exam. Associated symptoms include abnormal vaginal bleeding. Pertinent negatives include abnormal bleeding (hematology), anxiety, decreased libido, depression, difficulty falling sleep, dyspareunia, history of infertility, nocturia, sexual dysfunction, sleep disturbances, urinary  incontinence, urinary urgency, vaginal discharge and vaginal itching. Diet regular.The patient states her exercise level is    . The patient's tobacco use is:  Social History   Tobacco Use  Smoking Status Never  Smokeless Tobacco Never  . She has been exposed to passive smoke. The patient's alcohol use is:  Social History   Substance and Sexual Activity  Alcohol Use Yes   Alcohol/week: 2.0 - 4.0 standard drinks of alcohol   Types: 2 - 4 Standard drinks or equivalent per week   Comment: weekends   Review of Systems  Constitutional: Negative.   HENT: Negative.    Eyes: Negative.   Respiratory: Negative.    Cardiovascular: Negative.   Gastrointestinal: Negative.   Endocrine: Negative.   Genitourinary: Negative.   Musculoskeletal: Negative.   Skin: Negative.   Allergic/Immunologic: Negative.   Neurological: Negative.   Hematological: Negative.   Psychiatric/Behavioral: Negative.       Today's Vitals   03/01/22 0911  BP: 118/82  Pulse: 89  Temp: 98.4 F (36.9 C)  SpO2: 98%  Weight: 167 lb 9.6 oz (76 kg)  Height: 5' 8"$  (1.727 m)   Body mass index is 25.48 kg/m.  Wt Readings from Last 3 Encounters:  03/01/22 167 lb 9.6 oz (76 kg)  10/24/21 170 lb 6.4 oz (77.3 kg)  06/20/21 171 lb 6.4 oz (77.7 kg)    Objective:  Physical Exam Vitals and nursing note reviewed.  Constitutional:      General: She is not in acute distress.    Appearance: Normal appearance. She is well-developed. She is obese.  HENT:     Head: Normocephalic and atraumatic.     Right Ear: Hearing, tympanic membrane, ear canal and external ear normal. There is no impacted cerumen.     Left Ear: Hearing, tympanic membrane, ear canal and external ear normal. There is no impacted cerumen.     Nose:     Comments: Deferred - masked    Mouth/Throat:     Comments: Deferred - masked Eyes:     General: Lids are normal.     Extraocular Movements: Extraocular movements intact.     Conjunctiva/sclera:  Conjunctivae normal.     Pupils: Pupils are equal, round, and reactive to light.     Funduscopic exam:    Right eye: No papilledema.        Left eye: No papilledema.  Neck:     Thyroid: No thyroid mass.     Vascular: No carotid bruit.  Cardiovascular:     Rate and Rhythm: Normal rate and regular rhythm.     Pulses: Normal pulses.     Heart sounds: Normal  heart sounds. No murmur heard. Pulmonary:     Effort: Pulmonary effort is normal.     Breath sounds: Normal breath sounds.  Chest:     Chest wall: No mass.  Breasts:    Tanner Score is 5.     Right: Normal. No mass or tenderness.     Left: Normal. No mass or tenderness.  Abdominal:     General: Abdomen is flat. Bowel sounds are normal. There is no distension.     Palpations: Abdomen is soft.     Tenderness: There is no abdominal tenderness.  Genitourinary:    Rectum: Guaiac result negative.     Comments: deferred Musculoskeletal:        General: No swelling. Normal range of motion.     Cervical back: Full passive range of motion without pain, normal range of motion and neck supple.     Right lower leg: No edema.     Left lower leg: No edema.  Lymphadenopathy:     Upper Body:     Right upper body: No supraclavicular, axillary or pectoral adenopathy.     Left upper body: No supraclavicular, axillary or pectoral adenopathy.  Skin:    General: Skin is warm and dry.     Capillary Refill: Capillary refill takes less than 2 seconds.     Findings: Rash present.     Comments: Erythematous rash beneath both breasts  Neurological:     General: No focal deficit present.     Mental Status: She is alert and oriented to person, place, and time.     Cranial Nerves: No cranial nerve deficit.     Sensory: No sensory deficit.  Psychiatric:        Mood and Affect: Mood normal.        Behavior: Behavior normal.        Thought Content: Thought content normal.        Judgment: Judgment normal.      Assessment And Plan:     1.  Encounter for general adult medical examination w/o abnormal findings Comments: A full exam was performed. Importance of monthly self breast exams was discussed with the patient. I will refer her to GYN for pelvic exam per request.  PATIENT IS ADVISED TO GET 30-45 MINUTES REGULAR EXERCISE NO LESS THAN FOUR TO FIVE DAYS PER WEEK - BOTH WEIGHTBEARING EXERCISES AND AEROBIC ARE RECOMMENDED.  PATIENT IS ADVISED TO FOLLOW A HEALTHY DIET WITH AT LEAST SIX FRUITS/VEGGIES PER DAY, DECREASE INTAKE OF RED MEAT, AND TO INCREASE FISH INTAKE TO TWO DAYS PER WEEK.  MEATS/FISH SHOULD NOT BE FRIED, BAKED OR BROILED IS PREFERABLE.  IT IS ALSO IMPORTANT TO CUT BACK ON YOUR SUGAR INTAKE. PLEASE AVOID ANYTHING WITH ADDED SUGAR, CORN SYRUP OR OTHER SWEETENERS. IF YOU MUST USE A SWEETENER, YOU CAN TRY STEVIA. IT IS ALSO IMPORTANT TO AVOID ARTIFICIALLY SWEETENERS AND DIET BEVERAGES. LASTLY, I SUGGEST WEARING SPF 50 SUNSCREEN ON EXPOSED PARTS AND ESPECIALLY WHEN IN THE DIRECT SUNLIGHT FOR AN EXTENDED PERIOD OF TIME.  PLEASE AVOID FAST FOOD RESTAURANTS AND INCREASE YOUR WATER INTAKE. - CMP14+EGFR - CBC - Lipid panel - Ambulatory referral to Obstetrics / Gynecology - Vitamin D (25 hydroxy)  2. Intertrigo Comments: She is scheduled to see Derm next week. Advised she may benefit from nystatin powder to affected area daily.  3. BMI 25.0-25.9,adult Comments: She is encouraged to aim for at least 150 minutes of exercise/week.   Patient was given opportunity to ask questions. Patient  verbalized understanding of the plan and was able to repeat key elements of the plan. All questions were answered to their satisfaction.   I, Michelle Greenland, MD, have reviewed all documentation for this visit. The documentation on 03/01/22 for the exam, diagnosis, procedures, and orders are all accurate and complete.   THE PATIENT IS ENCOURAGED TO PRACTICE SOCIAL DISTANCING DUE TO THE COVID-19 PANDEMIC.

## 2022-03-01 NOTE — Patient Instructions (Signed)

## 2022-03-02 ENCOUNTER — Encounter: Payer: Self-pay | Admitting: Internal Medicine

## 2022-03-02 LAB — CMP14+EGFR
ALT: 20 IU/L (ref 0–32)
AST: 23 IU/L (ref 0–40)
Albumin/Globulin Ratio: 1.7 (ref 1.2–2.2)
Albumin: 4.5 g/dL (ref 3.9–4.9)
Alkaline Phosphatase: 76 IU/L (ref 44–121)
BUN/Creatinine Ratio: 19 (ref 12–28)
BUN: 13 mg/dL (ref 8–27)
Bilirubin Total: 0.3 mg/dL (ref 0.0–1.2)
CO2: 25 mmol/L (ref 20–29)
Calcium: 9.8 mg/dL (ref 8.7–10.3)
Chloride: 101 mmol/L (ref 96–106)
Creatinine, Ser: 0.7 mg/dL (ref 0.57–1.00)
Globulin, Total: 2.6 g/dL (ref 1.5–4.5)
Glucose: 86 mg/dL (ref 70–99)
Potassium: 4.6 mmol/L (ref 3.5–5.2)
Sodium: 139 mmol/L (ref 134–144)
Total Protein: 7.1 g/dL (ref 6.0–8.5)
eGFR: 98 mL/min/{1.73_m2} (ref >59)

## 2022-03-02 LAB — CBC
Hematocrit: 40.6 % (ref 34.0–46.6)
Hemoglobin: 13.5 g/dL (ref 11.1–15.9)
MCH: 31.5 pg (ref 26.6–33.0)
MCHC: 33.3 g/dL (ref 31.5–35.7)
MCV: 95 fL (ref 79–97)
Platelets: 312 10*3/uL (ref 150–450)
RBC: 4.29 x10E6/uL (ref 3.77–5.28)
RDW: 12.4 % (ref 11.7–15.4)
WBC: 6 10*3/uL (ref 3.4–10.8)

## 2022-03-02 LAB — LIPID PANEL
Chol/HDL Ratio: 3.4 ratio (ref 0.0–4.4)
Cholesterol, Total: 258 mg/dL — ABNORMAL HIGH (ref 100–199)
HDL: 76 mg/dL (ref >39)
LDL Chol Calc (NIH): 165 mg/dL — ABNORMAL HIGH (ref 0–99)
Triglycerides: 100 mg/dL (ref 0–149)
VLDL Cholesterol Cal: 17 mg/dL (ref 5–40)

## 2022-03-02 LAB — VITAMIN D 25 HYDROXY (VIT D DEFICIENCY, FRACTURES): Vit D, 25-Hydroxy: 65.9 ng/mL (ref 30.0–100.0)

## 2022-03-05 ENCOUNTER — Ambulatory Visit (INDEPENDENT_AMBULATORY_CARE_PROVIDER_SITE_OTHER): Payer: 59 | Admitting: Dermatology

## 2022-03-05 ENCOUNTER — Encounter: Payer: 59 | Admitting: Dermatology

## 2022-03-05 VITALS — BP 129/81

## 2022-03-05 DIAGNOSIS — Z1283 Encounter for screening for malignant neoplasm of skin: Secondary | ICD-10-CM

## 2022-03-05 DIAGNOSIS — Z7189 Other specified counseling: Secondary | ICD-10-CM

## 2022-03-05 DIAGNOSIS — L814 Other melanin hyperpigmentation: Secondary | ICD-10-CM

## 2022-03-05 DIAGNOSIS — L821 Other seborrheic keratosis: Secondary | ICD-10-CM

## 2022-03-05 DIAGNOSIS — I781 Nevus, non-neoplastic: Secondary | ICD-10-CM | POA: Diagnosis not present

## 2022-03-05 DIAGNOSIS — L304 Erythema intertrigo: Secondary | ICD-10-CM

## 2022-03-05 DIAGNOSIS — L578 Other skin changes due to chronic exposure to nonionizing radiation: Secondary | ICD-10-CM | POA: Diagnosis not present

## 2022-03-05 DIAGNOSIS — Z85828 Personal history of other malignant neoplasm of skin: Secondary | ICD-10-CM

## 2022-03-05 DIAGNOSIS — Z86018 Personal history of other benign neoplasm: Secondary | ICD-10-CM

## 2022-03-05 DIAGNOSIS — D229 Melanocytic nevi, unspecified: Secondary | ICD-10-CM

## 2022-03-05 DIAGNOSIS — Z79899 Other long term (current) drug therapy: Secondary | ICD-10-CM

## 2022-03-05 MED ORDER — OPZELURA 1.5 % EX CREA: 1.0000 | TOPICAL_CREAM | Freq: Every day | CUTANEOUS | 6 refills | Status: AC

## 2022-03-05 NOTE — Progress Notes (Signed)
Follow-Up Visit   Subjective  Michelle Mccarthy is a 62 y.o. female who presents for the following: Annual Exam (History of dysplastic nevus and BCC - The patient presents for Total-Body Skin Exam (TBSE) for skin cancer screening and mole check.  The patient has spots, moles and lesions to be evaluated, some may be new or changing and the patient has concerns that these could be cancer./). Intertrigo is still symptomatic.  The following portions of the chart were reviewed this encounter and updated as appropriate:   Tobacco  Allergies  Meds  Problems  Med Hx  Surg Hx  Fam Hx     Review of Systems:  No other skin or systemic complaints except as noted in HPI or Assessment and Plan.  Objective  Well appearing patient in no apparent distress; mood and affect are within normal limits.  A full examination was performed including scalp, head, eyes, ears, nose, lips, neck, chest, axillae, abdomen, back, buttocks, bilateral upper extremities, bilateral lower extremities, hands, feet, fingers, toes, fingernails, and toenails. All findings within normal limits unless otherwise noted below.  Bilateral Inframammary Fold No active areas today       Bilateral Inframammary Fold Dilated blood vessels   Assessment & Plan   History of Dysplastic Nevi - No evidence of recurrence today - Recommend regular full body skin exams - Recommend daily broad spectrum sunscreen SPF 30+ to sun-exposed areas, reapply every 2 hours as needed.  - Call if any new or changing lesions are noted between office visits  History of Basal Cell Carcinoma of the Skin - No evidence of recurrence today - Recommend regular full body skin exams - Recommend daily broad spectrum sunscreen SPF 30+ to sun-exposed areas, reapply every 2 hours as needed.  - Call if any new or changing lesions are noted between office visits  Lentigines - Scattered tan macules - Due to sun exposure - Benign-appearing, observe -  Recommend daily broad spectrum sunscreen SPF 30+ to sun-exposed areas, reapply every 2 hours as needed. - Call for any changes  Seborrheic Keratoses - Stuck-on, waxy, tan-brown papules and/or plaques  - Benign-appearing - Discussed benign etiology and prognosis. - Observe - Call for any changes  Melanocytic Nevi - Tan-brown and/or pink-flesh-colored symmetric macules and papules - Benign appearing on exam today - Observation - Call clinic for new or changing moles - Recommend daily use of broad spectrum spf 30+ sunscreen to sun-exposed areas.   Hemangiomas - Red papules - Discussed benign nature - Observe - Call for any changes  Actinic Damage - Chronic condition, secondary to cumulative UV/sun exposure - diffuse scaly erythematous macules with underlying dyspigmentation - Recommend daily broad spectrum sunscreen SPF 30+ to sun-exposed areas, reapply every 2 hours as needed.  - Staying in the shade or wearing long sleeves, sun glasses (UVA+UVB protection) and wide brim hats (4-inch brim around the entire circumference of the hat) are also recommended for sun protection.  - Call for new or changing lesions.  Skin cancer screening performed today.  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 Condition is symptomatic / bothersome to patient. Not to goal. Intertrigo is a chronic recurrent rash that occurs in skin fold areas that may be associated with friction; heat; moisture; yeast; fungus; and bacteria.  It is exacerbated by increased movement / activity; sweating; and higher atmospheric temperature.  Start Zeasorb AF powder Start Opzelura cream qd prn flares  Ruxolitinib Phosphate (OPZELURA) 1.5 % CREA - Bilateral Inframammary Fold Apply 1 Application topically daily. Prn flares  Telangiectasia Bilateral Inframammary Fold Counseling for BBL / IPL / Laser and  Coordination of Care Discussed the treatment option of Broad Band Light (BBL) /Intense Pulsed Light (IPL)/ Laser for skin discoloration, including brown spots and redness.  Typically we recommend at least 1-3 treatment sessions about 5-8 weeks apart for best results.  Cannot have tanned skin when BBL performed, and regular use of sunscreen is advised after the procedure to help maintain results. The patient's condition may also require "maintenance treatments" in the future.  The fee for BBL / laser treatments is $350 per treatment session for the whole face.  A fee can be quoted for other parts of the body.  Insurance typically does not pay for BBL/laser treatments and therefore the fee is an out-of-pocket cost.  Return 3 mos for Intertrigo, Follow up as scheduled.  I, Ashok Cordia, CMA, am acting as scribe for Sarina Ser, MD . Documentation: I have reviewed the above documentation for accuracy and completeness, and I agree with the above.  Sarina Ser, MD

## 2022-03-05 NOTE — Patient Instructions (Signed)
Zeasorb AF powder    Due to recent changes in healthcare laws, you may see results of your pathology and/or laboratory studies on MyChart before the doctors have had a chance to review them. We understand that in some cases there may be results that are confusing or concerning to you. Please understand that not all results are received at the same time and often the doctors may need to interpret multiple results in order to provide you with the best plan of care or course of treatment. Therefore, we ask that you please give [place] 2 business days to thoroughly review all your results before contacting the office for clarification. Should we see a critical lab result, you will be contacted sooner.   If You Need Anything After Your Visit  If you have any questions or concerns for your doctor, please call our main line at [phone] and press option 4 to reach your doctor's medical assistant. If no one answers, please leave a voicemail as directed and we will return your call as soon as possible. Messages left after 4 pm will be answered the following business day.   You may also send [place] a message via [place]. We typically respond to MyChart messages within 1-2 business days.  For prescription refills, please ask your pharmacy to contact our office. Our fax number is [phone].  If you have an urgent issue when the clinic is closed that cannot wait until the next business day, you can page your doctor at the number below.    Please note that while we do our best to be available for urgent issues outside of office hours, we are not available 24/7.   If you have an urgent issue and are unable to reach [place], you may choose to seek medical care at your doctor's office, retail clinic, urgent care center, or emergency room.  If you have a medical emergency, please immediately call 911 or go to the emergency department.  Pager Numbers  - Dr. Nehemiah Massed: [phone]  - Dr. Laurence Ferrari: [phone]  - Dr.  Nicole Kindred: [phone]  In the event of inclement weather, please call our main line at [phone] for an update on the status of any delays or closures.  Dermatology Medication Tips: Please keep the boxes that topical medications come in in order to help keep track of the instructions about where and how to use these. Pharmacies typically print the medication instructions only on the boxes and not directly on the medication tubes.   If your medication is too expensive, please contact our office at [phone] option 4 or send [place] a message through [place].   We are unable to tell what your co-pay for medications will be in advance as this is different depending on your insurance coverage. However, we may be able to find a substitute medication at lower cost or fill out paperwork to get insurance to cover a needed medication.   If a prior authorization is required to get your medication covered by your insurance company, please allow [place] 1-2 business days to complete this process.  Drug prices often vary depending on where the prescription is filled and some pharmacies may offer cheaper prices.  The website www.goodrx.com contains coupons for medications through different pharmacies. The prices here do not account for what the cost may be with help from insurance (it may be cheaper with your insurance), but the website can give you the price if you did not use any insurance.  - You can print the  associated coupon and take it with your prescription to the pharmacy.  - You may also stop by our office during regular business hours and pick up a GoodRx coupon card.  - If you need your prescription sent electronically to a different pharmacy, notify our office through [facility] or by phone at [phone] option 4.     Si Usted Necesita Algo Despus de Su Visita  Tambin puede enviarnos un mensaje a travs de Pharmacist, community. Por lo general respondemos a los mensajes de MyChart en el transcurso  de 1 a 2 das hbiles.  Para renovar recetas, por favor pida a su farmacia que se ponga en contacto con nuestra oficina. Harland Dingwall de fax es [place] [phone].  Si tiene un asunto urgente cuando la clnica est cerrada y que no puede esperar hasta el siguiente da hbil, puede llamar/localizar a su doctor(a) al nmero que aparece a continuacin.   Por favor, tenga en cuenta que aunque hacemos todo lo posible para estar disponibles para asuntos urgentes fuera del horario de [place], no estamos disponibles las 24 horas del da, los 7 das de la [place].   Si tiene un problema urgente y no puede comunicarse con nosotros, puede optar por buscar atencin mdica  en el consultorio de su doctor(a), en una clnica privada, en un centro de atencin urgente o en una sala de emergencias.  Si tiene Engineering geologist, por favor llame inmediatamente al 911 o vaya a la sala de emergencias.  Nmeros de bper  - Dr. Nehemiah Massed: [phone]  - Dra. Moye: [phone]  - Dra. Nicole Kindred: [phone]  En caso de inclemencias del [place], por favor llame a Johnsie Kindred principal al [phone] para una actualizacin sobre el [place] de cualquier retraso o cierre.  Consejos para la medicacin en dermatologa: Por favor, guarde las cajas en las que vienen los medicamentos de uso tpico para ayudarle a seguir las instrucciones sobre dnde y cmo usarlos. Las farmacias generalmente imprimen las instrucciones del medicamento slo en las cajas y no directamente en los tubos del [place].   Si su medicamento es muy caro, por favor, pngase en contacto con Zigmund Daniel llamando al [phone] y presione la opcin 4 o envenos un mensaje a travs de Pharmacist, community.   No podemos decirle cul ser su copago por los medicamentos por adelantado ya que esto es diferente dependiendo de la cobertura de su seguro. Sin embargo, es posible que podamos encontrar un medicamento sustituto a Electrical engineer un formulario  para que el seguro cubra el medicamento que se considera necesario.   Si se requiere una autorizacin previa para que su compaa de seguros [place] su medicamento, por favor permtanos de 1 a 2 das hbiles para completar este proceso.  Los precios de los medicamentos varan con frecuencia dependiendo del Environmental consultant de dnde se surte la receta y alguna farmacias pueden ofrecer precios ms baratos.  El sitio web www.goodrx.com tiene cupones para medicamentos de Airline pilot. Los precios aqu no tienen en cuenta lo que podra costar con la ayuda del seguro (puede ser ms barato con su seguro), pero el sitio web puede darle el precio si no utiliz Research scientist (physical sciences).  - Puede imprimir el cupn correspondiente y llevarlo con su receta a la farmacia.  - Tambin puede pasar por nuestra oficina durante el horario de atencin regular y Charity fundraiser una tarjeta de cupones de GoodRx.  - Si necesita que su receta se enve electrnicamente a Chiropodist, informe a nuestra oficina a  Lawerance Cruel de MyChart de [place] o por telfono llamando al [phone] y presione la opcin 4.

## 2022-03-13 ENCOUNTER — Encounter: Payer: Self-pay | Admitting: Dermatology

## 2022-06-06 ENCOUNTER — Other Ambulatory Visit: Payer: Self-pay | Admitting: Internal Medicine

## 2022-06-06 DIAGNOSIS — Z1231 Encounter for screening mammogram for malignant neoplasm of breast: Secondary | ICD-10-CM

## 2022-06-21 ENCOUNTER — Ambulatory Visit
Admission: RE | Admit: 2022-06-21 | Discharge: 2022-06-21 | Disposition: A | Discharge status: Home / Self Care | Payer: 59 | Source: Ambulatory Visit | Attending: Internal Medicine | Admitting: Internal Medicine

## 2022-06-21 DIAGNOSIS — Z1231 Encounter for screening mammogram for malignant neoplasm of breast: Secondary | ICD-10-CM | POA: Diagnosis present

## 2022-07-05 ENCOUNTER — Ambulatory Visit: Payer: 59 | Admitting: Dermatology

## 2022-12-11 ENCOUNTER — Encounter: Payer: Self-pay | Admitting: Dermatology

## 2022-12-11 ENCOUNTER — Ambulatory Visit: Payer: 59 | Admitting: Dermatology

## 2022-12-11 DIAGNOSIS — L578 Other skin changes due to chronic exposure to nonionizing radiation: Secondary | ICD-10-CM

## 2022-12-11 DIAGNOSIS — L304 Erythema intertrigo: Secondary | ICD-10-CM | POA: Diagnosis not present

## 2022-12-11 DIAGNOSIS — D229 Melanocytic nevi, unspecified: Secondary | ICD-10-CM

## 2022-12-11 DIAGNOSIS — W908XXA Exposure to other nonionizing radiation, initial encounter: Secondary | ICD-10-CM

## 2022-12-11 DIAGNOSIS — L209 Atopic dermatitis, unspecified: Secondary | ICD-10-CM

## 2022-12-11 DIAGNOSIS — Z7189 Other specified counseling: Secondary | ICD-10-CM

## 2022-12-11 DIAGNOSIS — L719 Rosacea, unspecified: Secondary | ICD-10-CM | POA: Diagnosis not present

## 2022-12-11 DIAGNOSIS — L2089 Other atopic dermatitis: Secondary | ICD-10-CM

## 2022-12-11 DIAGNOSIS — Z79899 Other long term (current) drug therapy: Secondary | ICD-10-CM

## 2022-12-11 MED ORDER — DOXYCYCLINE HYCLATE 20 MG PO TABS: 20.0000 mg | ORAL_TABLET | Freq: Two times a day (BID) | ORAL | 2 refills | Status: DC

## 2022-12-11 NOTE — Patient Instructions (Addendum)
For rosacea  Start doxycycline 20 mg tab take 1 tab by mouth twice daily with food and drink for rosacea flares. Doxycycline should be taken with food to prevent nausea. Do not lay down for 30 minutes after taking. Be cautious with sun exposure and use good sun protection while on this medication. Pregnant women should not take this medication.   Start  [facility] metronidazole/ivermectin/azelaic acid twice daily as needed to affected areas on the face. The patient was advised this is not covered by insurance since it is made by a compounding pharmacy. They will receive an email to check out and the medication will be mailed to their home.    Instructions for [facility] Medications  One or more of your medications was sent to the [facility] mail order compounding pharmacy. You will receive an email from them and can purchase the medicine through that link. It will then be mailed to your home at the address you confirmed. If for any reason you do not receive an email from them, please check your spam folder. If you still do not find the email, please let us know. [facility] phone number is [phone].     Due to recent changes in healthcare laws, you may see results of your pathology and/or laboratory studies on MyChart before the doctors have had a chance to review them. We understand that in some cases there may be results that are confusing or concerning to you. Please understand that not all results are received at the same time and often the doctors may need to interpret multiple results in order to provide you with the best plan of care or course of treatment. Therefore, we ask that you please give [place] 2 business days to thoroughly review all your results before contacting the office for clarification. Should we see a critical lab result, you will be contacted sooner.   If You Need Anything After Your Visit  If you have any questions or concerns for your doctor, please  call our main line at [phone] and press option 4 to reach your doctor's medical assistant. If no one answers, please leave a voicemail as directed and we will return your call as soon as possible. Messages left after 4 pm will be answered the following business day.   You may also send [place] a message via MyChart. We typically respond to MyChart messages within 1-2 business days.  For prescription refills, please ask your pharmacy to contact our office. Our fax number is [phone].  If you have an urgent issue when the clinic is closed that cannot wait until the next business day, you can page your doctor at the number below.    Please note that while we do our best to be available for urgent issues outside of office hours, we are not available 24/7.   If you have an urgent issue and are unable to reach [place], you may choose to seek medical care at your doctor's office, retail clinic, urgent care center, or emergency room.  If you have a medical emergency, please immediately call 911 or go to the emergency department.  Pager Numbers  - Dr. Gwen Pounds: [phone]  - Dr. Roseanne Reno: [phone]  - Dr. Katrinka Blazing: [phone]   In the event of inclement weather, please call our main line at [phone] for an update on the status of any delays or closures.  Dermatology Medication Tips: Please keep the boxes that topical medications come in in order to help keep track  of the instructions about where and how to use these. Pharmacies typically print the medication instructions only on the boxes and not directly on the medication tubes.   If your medication is too expensive, please contact our office at [phone] option 4 or send [place] a message through MyChart.   We are unable to tell what your co-pay for medications will be in advance as this is different depending on your insurance coverage. However, we may be able to find a substitute medication at lower cost or fill out paperwork to get  insurance to cover a needed medication.   If a prior authorization is required to get your medication covered by your insurance company, please allow [place] 1-2 business days to complete this process.  Drug prices often vary depending on where the prescription is filled and some pharmacies may offer cheaper prices.  The website www.goodrx.com contains coupons for medications through different pharmacies. The prices here do not account for what the cost may be with help from insurance (it may be cheaper with your insurance), but the website can give you the price if you did not use any insurance.  - You can print the associated coupon and take it with your prescription to the pharmacy.  - You may also stop by our office during regular business hours and pick up a GoodRx coupon card.  - If you need your prescription sent electronically to a different pharmacy, notify our office through [facility] or by phone at [phone] option 4.     Si Usted Necesita Algo Despus de Su Visita  Tambin puede enviarnos un mensaje a travs de Clinical cytogeneticist. Por lo general respondemos a los mensajes de MyChart en el transcurso de 1 a 2 das hbiles.  Para renovar recetas, por favor pida a su farmacia que se ponga en contacto con nuestra oficina. Annie Sable de fax es [place] [phone].  Si tiene un asunto urgente cuando la clnica est cerrada y que no puede esperar hasta el siguiente da hbil, puede llamar/localizar a su doctor(a) al nmero que aparece a continuacin.   Por favor, tenga en cuenta que aunque hacemos todo lo posible para estar disponibles para asuntos urgentes fuera del horario de [place], no estamos disponibles las 24 horas del da, los 7 [address]  [address] de la [place].   Si tiene un problema urgente y no puede comunicarse con nosotros, puede optar por buscar atencin mdica  en el consultorio de su doctor(a), en una clnica privada, en un centro de atencin urgente o en una sala de emergencias.  Si  tiene Engineer, drilling, por favor llame inmediatamente al 911 o vaya a la sala de emergencias.  Nmeros de bper  - Dr. Gwen Pounds: [phone]  - Dra. Roseanne Reno: [phone]  - Dr. Katrinka Blazing: [phone]   En caso de inclemencias del tiempo, por favor llame a Lacy Duverney principal al [phone] para una actualizacin sobre el [place] de cualquier retraso o cierre.  Consejos para la medicacin en dermatologa: Por favor, guarde las cajas en las que vienen los medicamentos de uso tpico para ayudarle a seguir las instrucciones sobre dnde y cmo usarlos. Las farmacias generalmente imprimen las instrucciones del medicamento slo en las cajas y no directamente en los tubos del [place].   Si su medicamento es muy caro, por favor, pngase en contacto con Rolm Gala llamando al [phone] y presione la opcin 4 o envenos un mensaje a travs de Clinical cytogeneticist.   No podemos decirle cul ser su copago por los medicamentos  por adelantado ya que esto es diferente dependiendo de la cobertura de su seguro. Sin embargo, es posible que podamos encontrar un medicamento sustituto a Audiological scientist un formulario para que el seguro cubra el medicamento que se considera necesario.   Si se requiere una autorizacin previa para que su compaa de seguros [place] su medicamento, por favor permtanos de 1 a 2 das hbiles para completar [address]  Los precios de los medicamentos varan con frecuencia dependiendo del Environmental consultant de dnde se surte la receta y alguna farmacias pueden ofrecer precios ms baratos.  El sitio web www.goodrx.com tiene cupones para medicamentos de Health and safety inspector. Los precios aqu no tienen en cuenta lo que podra costar con la ayuda del seguro (puede ser ms barato con su seguro), pero el sitio web puede darle el precio si no utiliz Tourist information centre manager.  - Puede imprimir el cupn correspondiente y llevarlo con su receta a la farmacia.  - Tambin puede pasar por nuestra oficina  durante el horario de atencin regular y Education officer, museum una tarjeta de cupones de GoodRx.  - Si necesita que su receta se enve electrnicamente a una farmacia diferente, informe a nuestra oficina a travs de MyChart de [place] o por telfono llamando al [phone] y presione la opcin 4.

## 2022-12-11 NOTE — Progress Notes (Signed)
Follow-Up Visit   Subjective  Michelle Mccarthy is a 62 y.o. female who presents for the following: intertrigo follow up At b/l inframammary folds. Patient reports she has been clear. Has used opzelura cream and otc zeasorb af powder.   Patient also reports a red patch at right cheek she noticed a week ago. Denies any pain or symptoms with area.   The patient has spots, moles and lesions to be evaluated, some may be new or changing and the patient may have concern these could be cancer.   The following portions of the chart were reviewed this encounter and updated as appropriate: medications, allergies, medical history  Review of Systems:  No other skin or systemic complaints except as noted in HPI or Assessment and Plan.  Objective  Well appearing patient in no apparent distress; mood and affect are within normal limits.  A focused examination was performed of the following areas: B/l inframammary folds, face  Relevant exam findings are noted in the Assessment and Plan.  Assessment & Plan   Erythema intertrigo with atopic dermatitis  Bilateral Inframammary Fold  Exam: Clear today at exam   Chronic and persistent condition with duration or expected duration over one year. Condition is symptomatic / bothersome to patient. Not to goal. Intertrigo is a chronic recurrent rash that occurs in skin fold areas that may be associated with friction; heat; moisture; yeast; fungus; and bacteria.  It is exacerbated by increased movement / activity; sweating; and higher atmospheric temperature.   Continue as needed for flares Zeasorb AF powder Continue as needed  Opzelura cream qd prn flares  Rosacea  Related Medications Ivermectin 1 % CREA Apply 1 Application topically at bedtime.  doxycycline (PERIOSTAT) 20 MG tablet Take 1 tablet (20 mg total) by mouth 2 (two) times daily. Take with food and drink. For rosacea flares  Actinic skin damage  Counseling and coordination of  care  Medication management  Intertrigo  Related Medications hydrocortisone 2.5 % cream Apply topically 2 (two) times daily as needed (Rash). Qd to bid aa rash under breast until clear, then prn flares  Other atopic dermatitis  ROSACEA Exam At right malar cheek  erythema with telangiectasias +/- scattered inflammatory papules x 3  Chronic and persistent condition with duration or expected duration over one year. Condition is bothersome/symptomatic for patient. Currently flared.  Rosacea is a chronic progressive skin condition usually affecting the face of adults, causing redness and/or acne bumps. It is treatable but not curable. It sometimes affects the eyes (ocular rosacea) as well. It may respond to topical and/or systemic medication and can flare with stress, sun exposure, alcohol, exercise, topical steroids (including hydrocortisone/cortisone 10) and some foods.  Daily application of broad spectrum spf 30+ sunscreen to face is recommended to reduce flares.  Patient denies grittiness of the eyes  Treatment Plan Start doxycycline 20 mg tab - take 1 po bid with food and drink  for rosacea flares.  60 tab 2 rfs.   Doxycycline should be taken with food to prevent nausea. Do not lay down for 30 minutes after taking. Be cautious with sun exposure and use good sun protection while on this medication. Pregnant women should not take this medication.   Start [facility] metronidazole/ivermectin/azelaic acid twice daily as needed to affected areas on the face. The patient was advised this is not covered by insurance since it is made by a compounding pharmacy. They will receive an email to check out and the medication will be  mailed to their home.   Discussed if improved with doxycycline and cream can just continue with cream treatment if prefer.   Instructions for [facility] Medications  One or more of your medications was sent to the [facility] mail order compounding  pharmacy. You will receive an email from them and can purchase the medicine through that link. It will then be mailed to your home at the address you confirmed. If for any reason you do not receive an email from them, please check your spam folder. If you still do not find the email, please let us know. [facility] phone number is [phone].  ACTINIC DAMAGE - chronic, secondary to cumulative UV radiation exposure/sun exposure over time - diffuse scaly erythematous macules with underlying dyspigmentation - Recommend daily broad spectrum sunscreen SPF 30+ to sun-exposed areas, reapply every 2 hours as needed.  - Recommend staying in the shade or wearing long sleeves, sun glasses (UVA+UVB protection) and wide brim hats (4-inch brim around the entire circumference of the hat). - Call for new or changing lesions.  MELANOCYTIC NEVI Exam: Tan-brown and/or pink-flesh-colored symmetric macules and papules  Treatment Plan: Benign appearing on exam today. Recommend observation. Call clinic for new or changing moles. Recommend daily use of broad spectrum spf 30+ sunscreen to sun-exposed areas.    Return for 6 - 8 month tbse .  IAsher Muir, CMA, am acting as scribe for Armida Sans, MD.   Documentation: I have reviewed the above documentation for accuracy and completeness, and I agree with the above.  Armida Sans, MD

## 2023-01-07 ENCOUNTER — Other Ambulatory Visit: Payer: Self-pay

## 2023-01-07 DIAGNOSIS — L719 Rosacea, unspecified: Secondary | ICD-10-CM

## 2023-01-07 MED ORDER — DOXYCYCLINE HYCLATE 20 MG PO TABS: 20.0000 mg | ORAL_TABLET | Freq: Two times a day (BID) | ORAL | 1 refills | Status: DC

## 2023-01-07 NOTE — Progress Notes (Signed)
Pharmacy requesting 90 day supply

## 2023-03-18 ENCOUNTER — Encounter: Payer: 59 | Admitting: Internal Medicine

## 2023-03-18 ENCOUNTER — Ambulatory Visit (INDEPENDENT_AMBULATORY_CARE_PROVIDER_SITE_OTHER): Admitting: Internal Medicine

## 2023-03-18 ENCOUNTER — Encounter: Payer: Self-pay | Admitting: Internal Medicine

## 2023-03-18 VITALS — BP 110/70 | HR 80 | Temp 97.4°F | Ht 68.0 in | Wt 172.8 lb

## 2023-03-18 DIAGNOSIS — R053 Chronic cough: Secondary | ICD-10-CM | POA: Diagnosis not present

## 2023-03-18 DIAGNOSIS — Z0000 Encounter for general adult medical examination without abnormal findings: Secondary | ICD-10-CM | POA: Diagnosis not present

## 2023-03-18 DIAGNOSIS — J4530 Mild persistent asthma, uncomplicated: Secondary | ICD-10-CM | POA: Diagnosis not present

## 2023-03-18 DIAGNOSIS — E049 Nontoxic goiter, unspecified: Secondary | ICD-10-CM

## 2023-03-18 MED ORDER — OMEPRAZOLE MAGNESIUM 20 MG PO TBEC: 20.0000 mg | DELAYED_RELEASE_TABLET | Freq: Every day | ORAL | Status: AC

## 2023-03-18 NOTE — Assessment & Plan Note (Signed)

## 2023-03-18 NOTE — Assessment & Plan Note (Addendum)
 Reportedly chronic; however, she admits this has never been discussed at previous visits.  She is encouraged to limit her dairy intake. Pt advised her sx could also be due to PND vs. GERD. She has been taking OTC Prilosec for indigestion. I offered to change to pantoprazole OR increase dose of omeprazole and she declined. I will refer her to Pulmonary, Dr. Karna Christmas at [place] [facility] as requested.

## 2023-03-18 NOTE — Assessment & Plan Note (Signed)
 Chronic, previously followed at [facility] in [place]. She was advised she needed Pulmonary evaluation, but didn't place referral. I will refer her to Dr. Karna Christmas as requested. Encouraged to avoid known triggers.

## 2023-03-18 NOTE — Progress Notes (Signed)
 I,Victoria T Deloria Lair, CMA,acting as a Neurosurgeon for Gwynneth Aliment, MD.,have documented all relevant documentation on the behalf of Gwynneth Aliment, MD,as directed by  Gwynneth Aliment, MD while in the presence of Gwynneth Aliment, MD.  Subjective:    Patient ID: Michelle Mccarthy , female    DOB: February 06, 1960 , 63 y.o.   MRN: [id number]  Chief Complaint  Patient presents with   Annual Exam    HPI  Patient presents today for a physical. She is no longer followed by Dr.Harris for her GYN care. Patient states the practice has dissolved.    She complains of a cough flare up due to allergies. She went to her allergist on 02/26/23. She was told by specialist it would be a good idea for her to be referred to a pulmonologist. She is using new inhalers, with some improvement in her sx.      Past Medical History:  Diagnosis Date   Arthritis    back   Asthma    Atypical mole 11/27/2017   L mid side/mod   Atypical mole 12/18/2018   L mid to upper back/mod, L lateral buttock/mod   Family history of benign neoplasm of colon    Family history of ovarian cancer    9/20 genetic testing letter sent   Hx of basal cell carcinoma 09/18/2016   multiple sites    Hx of dysplastic nevus 06/11/2017   L ant waistline lateral/excision   Hx of migraines    Insomnia    Menopausal and female climacteric states    Migraine headache    2-3/week   Motion sickness    cars - back seat, circular motion   Postmenopausal atrophic vaginitis      Family History  Problem Relation Age of Onset   Dementia Mother    Colon cancer Father    Ovarian cancer Maternal Aunt 67   Breast cancer Neg Hx      Current Outpatient Medications:    albuterol (PROVENTIL HFA;VENTOLIN HFA) 108 (90 Base) MCG/ACT inhaler, , Disp: , Rfl:    azelastine (ASTELIN) 0.1 % nasal spray, USE 1 SPRAY IN EACH NOSTRIL TWICE A DAY NASALLY 30 DAY, Disp: , Rfl: 5   b complex vitamins capsule, Take 1 capsule by mouth daily., Disp: , Rfl:    BOTOX  100 units SOLR injection, every 3 (three) months. For migraines, Disp: , Rfl:    Cholecalciferol (VITAMIN D3) 5000 units TABS, Take 1 capsule by mouth once. 2,500 units daily, Disp: , Rfl:    eletriptan (RELPAX) 40 MG tablet, Take 40 mg by mouth as needed., Disp: , Rfl:    EPINEPHrine 0.3 mg/0.3 mL IJ SOAJ injection, AS DIRECTED INJECTION 30, Disp: , Rfl:    Galcanezumab-gnlm 120 MG/ML SOAJ, Inject into the skin. One injection monthly, Disp: , Rfl:    hydrocortisone 2.5 % cream, Apply topically 2 (two) times daily as needed (Rash). Qd to bid aa rash under breast until clear, then prn flares, Disp: 30 g, Rfl: 11   imipramine (TOFRANIL) 25 MG tablet, Take 25 mg by mouth daily., Disp: , Rfl:    Ivermectin 1 % CREA, Apply 1 Application topically at bedtime., Disp: 30 g, Rfl: 11   levocetirizine (XYZAL) 5 MG tablet, Take 5 mg by mouth every evening., Disp: , Rfl:    Multiple Vitamin (MULTIVITAMIN) tablet, Take 1 tablet by mouth daily., Disp: , Rfl:    omeprazole (PRILOSEC OTC) 20 MG tablet, Take 1 tablet (20  mg total) by mouth daily., Disp: , Rfl:    POTASSIUM CHLORIDE PO, Take 1 tablet by mouth once. 550 mg daily, Disp: , Rfl:    TURMERIC CURCUMIN PO, Take by mouth daily., Disp: , Rfl:    valACYclovir (VALTREX) 500 MG tablet, TAKE 1 TABLET (500 MG TOTAL) BY MOUTH 2 (TWO) TIMES DAILY FOR 7 DAYS. AS NEEDED FOR FLARES, Disp: 180 tablet, Rfl: 1   vitamin E 1000 UNIT capsule, Take 400 Units by mouth daily. , Disp: , Rfl:    doxycycline (PERIOSTAT) 20 MG tablet, Take 1 tablet (20 mg total) by mouth 2 (two) times daily. Take with food and drink. For rosacea flares (Patient not taking: Reported on 03/18/2023), Disp: 180 tablet, Rfl: 1   Ruxolitinib Phosphate (OPZELURA) 1.5 % CREA, Apply 1 Application topically daily. Prn flares (Patient not taking: Reported on 03/18/2023), Disp: 60 g, Rfl: 6   No Known Allergies    The patient states she uses post menopausal status for birth control. Patient's last menstrual  period was 04/06/2014.. Negative for Dysmenorrhea. Negative for: breast discharge, breast lump(s), breast pain and breast self exam. Associated symptoms include abnormal vaginal bleeding. Pertinent negatives include abnormal bleeding (hematology), anxiety, decreased libido, depression, difficulty falling sleep, dyspareunia, history of infertility, nocturia, sexual dysfunction, sleep disturbances, urinary incontinence, urinary urgency, vaginal discharge and vaginal itching. Diet regular.The patient states her exercise level is    . The patient's tobacco use is:  Social History   Tobacco Use  Smoking Status Never  Smokeless Tobacco Never  . She has been exposed to passive smoke. The patient's alcohol use is:  Social History   Substance and Sexual Activity  Alcohol Use Yes   Alcohol/week: 2.0 - 4.0 standard drinks of alcohol   Types: 2 - 4 Standard drinks or equivalent per week   Comment: weekends    Review of Systems  Constitutional: Negative.   HENT: Negative.    Eyes: Negative.   Respiratory:  Positive for cough.        She c/o chronic cough. Pt admits she has never discussed with me in the past. States she was "used ot it". It appears to have gotten out of hand in February. She was seen by allergist twice, asthma meds were changed. She wants Pulmonary referral.   Cardiovascular: Negative.   Gastrointestinal: Negative.   Endocrine: Negative.   Genitourinary: Negative.   Musculoskeletal: Negative.   Skin: Negative.   Allergic/Immunologic: Negative.   Neurological: Negative.   Hematological: Negative.   Psychiatric/Behavioral: Negative.       Today's Vitals   03/18/23 1009  BP: 110/70  Pulse: 80  Temp: (!) 97.4 F (36.3 C)  SpO2: 98%  Weight: 172 lb 12.8 oz (78.4 kg)  Height: 5\' 8"  (1.727 m)   Body mass index is 26.27 kg/m.  Wt Readings from Last 3 Encounters:  03/18/23 172 lb 12.8 oz (78.4 kg)  03/01/22 167 lb 9.6 oz (76 kg)  10/24/21 170 lb 6.4 oz (77.3 kg)      Objective:  Physical Exam Vitals and nursing note reviewed.  Constitutional:      General: She is not in acute distress.    Appearance: Normal appearance. She is well-developed.  HENT:     Head: Normocephalic and atraumatic.     Right Ear: Hearing, tympanic membrane, ear canal and external ear normal. There is no impacted cerumen.     Left Ear: Hearing, tympanic membrane, ear canal and external ear normal. There is no impacted  cerumen.     Nose:     Comments: Deferred - masked    Mouth/Throat:     Comments: Deferred - masked Eyes:     General: Lids are normal.     Extraocular Movements: Extraocular movements intact.     Conjunctiva/sclera: Conjunctivae normal.     Pupils: Pupils are equal, round, and reactive to light.     Funduscopic exam:    Right eye: No papilledema.        Left eye: No papilledema.  Neck:     Thyroid: No thyroid mass.     Vascular: No carotid bruit.     Comments: S. Enlarged thyroid Cardiovascular:     Rate and Rhythm: Normal rate and regular rhythm.     Pulses: Normal pulses.     Heart sounds: Normal heart sounds. No murmur heard. Pulmonary:     Effort: Pulmonary effort is normal.     Breath sounds: Normal breath sounds.  Chest:     Chest wall: No mass.  Breasts:    Tanner Score is 5.     Right: Normal. No mass or tenderness.     Left: Normal. No mass or tenderness.  Abdominal:     General: Abdomen is flat. Bowel sounds are normal. There is no distension.     Palpations: Abdomen is soft.     Tenderness: There is no abdominal tenderness.  Genitourinary:    Comments: deferred Musculoskeletal:        General: No swelling. Normal range of motion.     Cervical back: Full passive range of motion without pain, normal range of motion and neck supple.     Right lower leg: No edema.     Left lower leg: No edema.  Lymphadenopathy:     Upper Body:     Right upper body: No supraclavicular, axillary or pectoral adenopathy.     Left upper body: No  supraclavicular, axillary or pectoral adenopathy.  Skin:    General: Skin is warm and dry.     Capillary Refill: Capillary refill takes less than 2 seconds.  Neurological:     General: No focal deficit present.     Mental Status: She is alert and oriented to person, place, and time.     Cranial Nerves: No cranial nerve deficit.     Sensory: No sensory deficit.  Psychiatric:        Mood and Affect: Mood normal.        Behavior: Behavior normal.        Thought Content: Thought content normal.        Judgment: Judgment normal.         Assessment And Plan:     Encounter for general adult medical examination w/o abnormal findings Assessment & Plan: A full exam was performed.  Importance of monthly self breast exams was discussed with the patient.  She is advised to get 30-45 minutes of regular exercise, no less than four to five days per week. Both weight-bearing and aerobic exercises are recommended.  She is advised to follow a healthy diet with at least six fruits/veggies per day, decrease intake of red meat and other saturated fats and to increase fish intake to twice weekly.  Meats/fish should not be fried -- baked, boiled or broiled is preferable. It is also important to cut back on your sugar intake.  Be sure to read labels - try to avoid anything with added sugar, high fructose corn syrup or other sweeteners.  If you must use a sweetener, you can try stevia or monkfruit.  It is also important to avoid artificially sweetened foods/beverages and diet drinks. Lastly, wear SPF 50 sunscreen on exposed skin and when in direct sunlight for an extended period of time.  Be sure to avoid fast food restaurants and aim for at least 60 ounces of water daily.      Orders: -     CBC -     CMP14+EGFR -     Lipid panel -     TSH  Chronic cough Assessment & Plan: Reportedly chronic; however, she admits this has never been discussed at previous visits.  She is encouraged to limit her dairy intake. Pt  advised her sx could also be due to PND vs. GERD. She has been taking OTC Prilosec for indigestion. I offered to change to pantoprazole OR increase dose of omeprazole and she declined. I will refer her to Pulmonary, Dr. Karna Christmas at [facility] as requested.   Orders: -     Ambulatory referral to Pulmonology  Mild persistent asthma without complication Assessment & Plan: Chronic, previously followed at [facility] in [place]. She was advised she needed Pulmonary evaluation, but didn't place referral. I will refer her to Dr. Karna Christmas as requested. Encouraged to avoid known triggers.   Orders: -     Ambulatory referral to Pulmonology  Thyroid enlargement -     TSH  Other orders -     Omeprazole Magnesium; Take 1 tablet (20 mg total) by mouth daily.     Return for 1 year HM, . Patient was given opportunity to ask questions. Patient verbalized understanding of the plan and was able to repeat key elements of the plan. All questions were answered to their satisfaction.    I, Gwynneth Aliment, MD, have reviewed all documentation for this visit. The documentation on 03/18/23 for the exam, diagnosis, procedures, and orders are all accurate and complete.

## 2023-03-18 NOTE — Patient Instructions (Signed)

## 2023-03-19 LAB — LIPID PANEL
Chol/HDL Ratio: 2.7 ratio (ref 0.0–4.4)
Cholesterol, Total: 214 mg/dL — ABNORMAL HIGH (ref 100–199)
HDL: 79 mg/dL (ref >39)
LDL Chol Calc (NIH): 119 mg/dL — ABNORMAL HIGH (ref 0–99)
Triglycerides: 92 mg/dL (ref 0–149)
VLDL Cholesterol Cal: 16 mg/dL (ref 5–40)

## 2023-03-19 LAB — CBC
Hematocrit: 39.5 % (ref 34.0–46.6)
Hemoglobin: 13.3 g/dL (ref 11.1–15.9)
MCH: 32 pg (ref 26.6–33.0)
MCHC: 33.7 g/dL (ref 31.5–35.7)
MCV: 95 fL (ref 79–97)
Platelets: 312 10*3/uL (ref 150–450)
RBC: 4.16 x10E6/uL (ref 3.77–5.28)
RDW: 12.1 % (ref 11.7–15.4)
WBC: 6.6 10*3/uL (ref 3.4–10.8)

## 2023-03-19 LAB — CMP14+EGFR
ALT: 29 IU/L (ref 0–32)
AST: 30 IU/L (ref 0–40)
Albumin: 4.4 g/dL (ref 3.9–4.9)
Alkaline Phosphatase: 89 IU/L (ref 44–121)
BUN/Creatinine Ratio: 19 (ref 12–28)
BUN: 15 mg/dL (ref 8–27)
Bilirubin Total: 0.2 mg/dL (ref 0.0–1.2)
CO2: 25 mmol/L (ref 20–29)
Calcium: 9.6 mg/dL (ref 8.7–10.3)
Chloride: 101 mmol/L (ref 96–106)
Creatinine, Ser: 0.77 mg/dL (ref 0.57–1.00)
Globulin, Total: 2.3 g/dL (ref 1.5–4.5)
Glucose: 93 mg/dL (ref 70–99)
Potassium: 4.6 mmol/L (ref 3.5–5.2)
Sodium: 139 mmol/L (ref 134–144)
Total Protein: 6.7 g/dL (ref 6.0–8.5)
eGFR: 87 mL/min/{1.73_m2} (ref >59)

## 2023-03-19 LAB — TSH: TSH: 1.79 u[IU]/mL (ref 0.450–4.500)

## 2023-03-21 ENCOUNTER — Encounter: Payer: 59 | Admitting: Internal Medicine

## 2023-03-28 ENCOUNTER — Ambulatory Visit: Admitting: Dermatology

## 2023-03-28 DIAGNOSIS — R21 Rash and other nonspecific skin eruption: Secondary | ICD-10-CM

## 2023-03-28 DIAGNOSIS — L309 Dermatitis, unspecified: Secondary | ICD-10-CM

## 2023-03-28 MED ORDER — TRIAMCINOLONE ACETONIDE 0.1 % EX CREA: TOPICAL_CREAM | CUTANEOUS | 0 refills | Status: AC

## 2023-03-28 NOTE — Progress Notes (Signed)
   Follow Up Visit   Subjective  Michelle Mccarthy is a 63 y.o. female who presents for the following: Itchy Rash - on the chest x 4-5 days, currently using OTC HC cream which has helped some. No changes in body washes, shampoos, lotions, laundry detergents etc. Pt did have a cough and asthma flare, but no recent illnesses. Pt does have seasonal allergies for which she gets injections for. No h/o eczema. Pt does have cats that are in and outdoors. Hx of rosacea on the face but no where else on the scalp/body.  The following portions of the chart were reviewed this encounter and updated as appropriate: medications, allergies, medical history  Review of Systems:  No other skin or systemic complaints except as noted in HPI or Assessment and Plan.  Objective  Well appearing patient in no apparent distress; mood and affect are within normal limits.  A focused examination was performed of the following areas: The face and chest  Relevant exam findings are noted in the Assessment and Plan.    Assessment & Plan     RASH - contact dermatitis unclear etiology vs atopic dermatitis  Exam: clustered pink scaly papules at mid chest, itchy per pt  Treatment Plan: Start TMC 0.1% cream apply to aa's QD-BID until itchy rash cleared, up to 2-4 wks.   Topical steroids (such as triamcinolone, fluocinolone, fluocinonide, mometasone, clobetasol, halobetasol, betamethasone, hydrocortisone) can cause thinning and lightening of the skin if they are used for too long in the same area. Your physician has selected the right strength medicine for your problem and area affected on the body. Please use your medication only as directed by your physician to prevent side effects.   Recommend mild soap and moisturizing cream 1-2 times daily.   Return for appointment as scheduled.  Maylene Roes, CMA, am acting as scribe for Willeen Niece, MD .  Documentation: I have reviewed the above documentation for accuracy and  completeness, and I agree with the above.  Willeen Niece, MD

## 2023-03-28 NOTE — Patient Instructions (Signed)

## 2023-05-09 ENCOUNTER — Other Ambulatory Visit: Payer: Self-pay | Admitting: Emergency Medicine

## 2023-05-09 DIAGNOSIS — R053 Chronic cough: Secondary | ICD-10-CM

## 2023-05-15 ENCOUNTER — Ambulatory Visit
Admission: RE | Admit: 2023-05-15 | Discharge: 2023-05-15 | Disposition: A | Discharge status: Home / Self Care | Source: Ambulatory Visit | Attending: Emergency Medicine | Admitting: Emergency Medicine

## 2023-05-15 DIAGNOSIS — R053 Chronic cough: Secondary | ICD-10-CM | POA: Insufficient documentation

## 2023-06-26 ENCOUNTER — Encounter: Payer: Self-pay | Admitting: Dermatology

## 2023-06-26 ENCOUNTER — Ambulatory Visit: Payer: 59 | Admitting: Dermatology

## 2023-06-26 DIAGNOSIS — D239 Other benign neoplasm of skin, unspecified: Secondary | ICD-10-CM

## 2023-06-26 DIAGNOSIS — D2272 Melanocytic nevi of left lower limb, including hip: Secondary | ICD-10-CM

## 2023-06-26 DIAGNOSIS — Z1283 Encounter for screening for malignant neoplasm of skin: Secondary | ICD-10-CM | POA: Diagnosis not present

## 2023-06-26 DIAGNOSIS — L578 Other skin changes due to chronic exposure to nonionizing radiation: Secondary | ICD-10-CM | POA: Diagnosis not present

## 2023-06-26 DIAGNOSIS — D492 Neoplasm of unspecified behavior of bone, soft tissue, and skin: Secondary | ICD-10-CM

## 2023-06-26 DIAGNOSIS — L821 Other seborrheic keratosis: Secondary | ICD-10-CM

## 2023-06-26 DIAGNOSIS — Z86018 Personal history of other benign neoplasm: Secondary | ICD-10-CM

## 2023-06-26 DIAGNOSIS — W908XXA Exposure to other nonionizing radiation, initial encounter: Secondary | ICD-10-CM

## 2023-06-26 DIAGNOSIS — D225 Melanocytic nevi of trunk: Secondary | ICD-10-CM | POA: Diagnosis not present

## 2023-06-26 DIAGNOSIS — L814 Other melanin hyperpigmentation: Secondary | ICD-10-CM

## 2023-06-26 DIAGNOSIS — D485 Neoplasm of uncertain behavior of skin: Secondary | ICD-10-CM

## 2023-06-26 DIAGNOSIS — D1801 Hemangioma of skin and subcutaneous tissue: Secondary | ICD-10-CM

## 2023-06-26 DIAGNOSIS — Z85828 Personal history of other malignant neoplasm of skin: Secondary | ICD-10-CM

## 2023-06-26 DIAGNOSIS — D229 Melanocytic nevi, unspecified: Secondary | ICD-10-CM

## 2023-06-26 HISTORY — DX: Other benign neoplasm of skin, unspecified: D23.9

## 2023-06-26 NOTE — Patient Instructions (Signed)

## 2023-06-26 NOTE — Progress Notes (Signed)
 Follow-Up Visit   Subjective  Michelle Mccarthy is a 63 y.o. female who presents for the following: Skin Cancer Screening and Full Body Skin Exam The patient presents for Total-Body Skin Exam (TBSE) for skin cancer screening and mole check. The patient has spots, moles and lesions to be evaluated, some may be new or changing and the patient may have concern these could be cancer.  The following portions of the chart were reviewed this encounter and updated as appropriate: medications, allergies, medical history  Review of Systems:  No other skin or systemic complaints except as noted in HPI or Assessment and Plan.  Objective  Well appearing patient in no apparent distress; mood and affect are within normal limits.  A full examination was performed including scalp, head, eyes, ears, nose, lips, neck, chest, axillae, abdomen, back, buttocks, bilateral upper extremities, bilateral lower extremities, hands, feet, fingers, toes, fingernails, and toenails. All findings within normal limits unless otherwise noted below.   Relevant physical exam findings are noted in the Assessment and Plan.  R lat waist line 0.6 cm irregular brown macule.  L thigh near the knee 0.7 cm irregular brown macule.   Assessment & Plan   SKIN CANCER SCREENING PERFORMED TODAY.  ACTINIC DAMAGE - Chronic condition, secondary to cumulative UV/sun exposure - diffuse scaly erythematous macules with underlying dyspigmentation - Recommend daily broad spectrum sunscreen SPF 30+ to sun-exposed areas, reapply every 2 hours as needed.  - Staying in the shade or wearing long sleeves, sun glasses (UVA+UVB protection) and wide brim hats (4-inch brim around the entire circumference of the hat) are also recommended for sun protection.  - Call for new or changing lesions.  LENTIGINES, SEBORRHEIC KERATOSES, HEMANGIOMAS - Benign normal skin lesions - Benign-appearing - Call for any changes  MELANOCYTIC NEVI - Tan-brown and/or  pink-flesh-colored symmetric macules and papules - Benign appearing on exam today - Observation - Call clinic for new or changing moles - Recommend daily use of broad spectrum spf 30+ sunscreen to sun-exposed areas.   HISTORY OF BASAL CELL CARCINOMA OF THE SKIN - No evidence of recurrence today - Recommend regular full body skin exams - Recommend daily broad spectrum sunscreen SPF 30+ to sun-exposed areas, reapply every 2 hours as needed.  - Call if any new or changing lesions are noted between office visits  HISTORY OF DYSPLASTIC NEVUS No evidence of recurrence today Recommend regular full body skin exams Recommend daily broad spectrum sunscreen SPF 30+ to sun-exposed areas, reapply every 2 hours as needed.  Call if any new or changing lesions are noted between office visits   NEOPLASM OF UNCERTAIN BEHAVIOR OF SKIN (2) R lat waist line Epidermal / dermal shaving  Lesion diameter (cm):  0.6 Informed consent: discussed and consent obtained   Timeout: patient name, date of birth, surgical site, and procedure verified   Procedure prep:  Patient was prepped and draped in usual sterile fashion Prep type:  Isopropyl alcohol Anesthesia: the lesion was anesthetized in a standard fashion   Anesthetic:  1% lidocaine  w/ epinephrine 1-100,000 buffered w/ 8.4% NaHCO3 Instrument used: flexible razor blade   Hemostasis achieved with: pressure, aluminum chloride and electrodesiccation   Outcome: patient tolerated procedure well   Post-procedure details: sterile dressing applied and wound care instructions given   Dressing type: bandage (Mupirocin 2% ointment)   Specimen 1 - Surgical pathology Differential Diagnosis: D48.5 r/o dysplastic nevus Check Margins: Yes L thigh near the knee Epidermal / dermal shaving  Lesion diameter (cm):  0.7 Informed consent: discussed and consent obtained   Timeout: patient name, date of birth, surgical site, and procedure verified   Procedure prep:  Patient  was prepped and draped in usual sterile fashion Prep type:  Isopropyl alcohol Anesthesia: the lesion was anesthetized in a standard fashion   Anesthetic:  1% lidocaine  w/ epinephrine 1-100,000 buffered w/ 8.4% NaHCO3 Instrument used: flexible razor blade   Hemostasis achieved with: pressure, aluminum chloride and electrodesiccation   Outcome: patient tolerated procedure well   Post-procedure details: sterile dressing applied and wound care instructions given   Dressing type: bandage (Mupirocin 2% ointment)   Specimen 2 - Surgical pathology Differential Diagnosis: D48.5 r/o dysplastic nevus Check Margins: Yes  Return in about 1 year (around 06/25/2024) for TBSE.  Arlinda Lais, CMA, am acting as scribe for Celine Collard, MD .   Documentation: I have reviewed the above documentation for accuracy and completeness, and I agree with the above.  Celine Collard, MD

## 2023-07-01 ENCOUNTER — Ambulatory Visit: Payer: Self-pay | Admitting: Dermatology

## 2023-07-01 LAB — SURGICAL PATHOLOGY

## 2023-07-02 NOTE — Telephone Encounter (Addendum)
 Called and discussed bx results with patient. She verbalized understanding. Patient was scheduled for surgery with Dr. Claudene to treat severe dysplastic at Left thigh near the knee.  Patient verbalized understanding and denied further questions.   ----- Message from Alm Rhyme sent at 07/01/2023  6:02 PM EDT ----- FINAL DIAGNOSIS        1. Skin, R lat waist line :       DYSPLASTIC JUNCTIONAL NEVUS WITH MODERATE ATYPIA, LIMITED MARGINS FREE        2. Skin, L thigh near the knee :       DYSPLASTIC COMPOUND NEVUS WITH SEVERE ATYPIA, DEEP MARGIN INVOLVED, SEE       DESCRIPTION    1- Moderate dysplastic Recheck next visit  2- Severe dysplastic Schedule surgery with Dr Jackquline, Dr Claudene or Dr Corey (I, Dr Rhyme, am not doing surgery since my own spinal neck surgery) ----- Message ----- From: Interface, [facility] Sent: 07/01/2023   5:36 PM EDT To: Alm JAYSON Rhyme, MD

## 2023-07-09 ENCOUNTER — Other Ambulatory Visit: Payer: Self-pay | Admitting: Internal Medicine

## 2023-07-09 ENCOUNTER — Other Ambulatory Visit: Payer: Self-pay | Admitting: Dermatology

## 2023-07-09 DIAGNOSIS — L719 Rosacea, unspecified: Secondary | ICD-10-CM

## 2023-07-09 DIAGNOSIS — Z1231 Encounter for screening mammogram for malignant neoplasm of breast: Secondary | ICD-10-CM

## 2023-07-26 ENCOUNTER — Ambulatory Visit
Admission: RE | Admit: 2023-07-26 | Discharge: 2023-07-26 | Disposition: A | Discharge status: Home / Self Care | Source: Ambulatory Visit | Attending: Internal Medicine | Admitting: Internal Medicine

## 2023-07-26 DIAGNOSIS — Z1231 Encounter for screening mammogram for malignant neoplasm of breast: Secondary | ICD-10-CM | POA: Insufficient documentation

## 2023-08-02 ENCOUNTER — Encounter (INDEPENDENT_AMBULATORY_CARE_PROVIDER_SITE_OTHER): Payer: Self-pay

## 2023-08-06 ENCOUNTER — Ambulatory Visit (INDEPENDENT_AMBULATORY_CARE_PROVIDER_SITE_OTHER): Admitting: Otolaryngology

## 2023-08-06 ENCOUNTER — Encounter (INDEPENDENT_AMBULATORY_CARE_PROVIDER_SITE_OTHER): Payer: Self-pay | Admitting: Otolaryngology

## 2023-08-06 VITALS — BP 131/83 | HR 85

## 2023-08-06 DIAGNOSIS — R0981 Nasal congestion: Secondary | ICD-10-CM | POA: Diagnosis not present

## 2023-08-06 DIAGNOSIS — R053 Chronic cough: Secondary | ICD-10-CM | POA: Diagnosis not present

## 2023-08-06 DIAGNOSIS — R0989 Other specified symptoms and signs involving the circulatory and respiratory systems: Secondary | ICD-10-CM

## 2023-08-06 DIAGNOSIS — J3089 Other allergic rhinitis: Secondary | ICD-10-CM

## 2023-08-06 DIAGNOSIS — R0982 Postnasal drip: Secondary | ICD-10-CM

## 2023-08-06 DIAGNOSIS — K219 Gastro-esophageal reflux disease without esophagitis: Secondary | ICD-10-CM | POA: Diagnosis not present

## 2023-08-06 NOTE — Progress Notes (Signed)
 ENT CONSULT:  Reason for Consult: chronic cough and chronic throat clearing     HPI: Discussed the use of AI scribe software for clinical note transcription with the patient, who gave verbal consent to proceed.  History of Present Illness Michelle Mccarthy is a 63 year old female with hx of asthma environmental allergies and reflux who presents for evaluation of cough and chronic throat clearing. She was referred by asthma specialists at [facility] for evaluation of reflux symptoms.  She experiences a sensation of something constantly in her throat and had a history of a bad spell with asthma in February-March, which led to her being placed on Xolair asthma shots. During that time, she experienced violent coughing for three to four weeks.  She has a long-standing history of allergies, having received allergy  shots for years. She underwent deviated septum surgery in the early 2000s, which helped with breathing difficulties, although she still tends to breathe through her mouth. Currently, she takes an Allegra-like medication nightly and receives allergy  shots two to three times a week. She also uses a nasal spray Nasonex occasionally when sneezing becomes excessive.  She is on pantoprazole for reflux, which she feels has been working. She experiences chronic cough and throat clearing.  No current postnasal drainage but reports a history of allergies.   Records Reviewed:  [facility] Marval Gum NP visit 07/11/23 Michelle Mccarthy is a 63 year old female with asthma and GERD who presents for evaluation of previous shortness of breath and cough. She is reporting improved breathing and cough at this visit.   Breathing and cough have improved with the use of Symbicort, although a slight cough persists. Symbicort is used regularly to manage asthma symptoms and prevent exacerbations. Xolair is also prescribed and appears beneficial. She has previously been treated with prednisone  and azithromycin   for inflammation and infection.  GERD symptoms are effectively controlled with Aciphex once daily, and she does not require the initially prescribed twice-daily dosage. She is cautious with her diet to avoid exacerbating GERD symptoms, occasionally taking medication before meals if necessary.  She has a history of chronic sinus issues and has undergone septoplasty to address breathing difficulties. Further evaluation of her sinus and upper airway issues is being considered for evaluation of previous uncontrolled GERD symptoms. She was given montelukast for her chronic sinus but did stop this d/t to triggering her migraines.   Asthma with small airways disease Findings noted on recent CT of pulmonary scarring, airspace opacities, nodules, pleural effusions, tree-in-bud pattern, small airways disease, alveolitis, managed with Symbicort to reduce inflammation and prevent exacerbations. - Continue Symbicort 160-4.5 mcg/actuation inhalation 2 times daily. - Use Mucinex for mucus thinning. - Perform deep breathing exercises 4-6 times daily. - Refill Xopenex HFA inhalation as needed for flare-ups.  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 - Continue current GERD management to prevent further scarring. - Consider ENT referral to assess for upper airway scarring.  Gastroesophageal Reflux Disease (GERD) GERD managed with Pantoprazole, contributing to lung scarring and inflammation. - Continue Pantoprazole 20 mg oral once daily. - Increase Pantoprazole to twice daily for 21 days if symptoms recur. - Discuss with ENT for evaluation of laryngeal evaluation for scarring.     Past Medical History:  Diagnosis Date   Arthritis    back   Asthma    Atypical mole 11/27/2017   L mid side/mod   Atypical mole  12/18/2018   L mid to upper back/mod, L lateral buttock/mod   Family history of benign neoplasm of colon    Family history of  ovarian cancer    9/20 genetic testing letter sent   Hx of basal cell carcinoma 09/18/2016   multiple sites    Hx of dysplastic nevus 06/11/2017   L ant waistline lateral/excision   Hx of migraines    Insomnia    Menopausal and female climacteric states    Migraine headache    2-3/week   Motion sickness    cars - back seat, circular motion   Postmenopausal atrophic vaginitis     Past Surgical History:  Procedure Laterality Date   BACK SURGERY  2016   Microdiskectomy   COLONOSCOPY WITH PROPOFOL  N/A 09/25/2019   Procedure: COLONOSCOPY WITH PROPOFOL ;  Surgeon: Jinny Carmine, MD;  Location: [facility];  Service: Endoscopy;  Laterality: N/A;  priority 4   LUMBAR FUSION  04/20/2020   Dr. Melodie   POLYPECTOMY  09/25/2019   Procedure: POLYPECTOMY;  Surgeon: Jinny Carmine, MD;  Location: [facility];  Service: Endoscopy;;    Family History  Problem Relation Age of Onset   Dementia Mother    Colon cancer Father    Ovarian cancer Maternal Aunt 36   Breast cancer Neg Hx     Social History:  reports that she has never smoked. She has never used smokeless tobacco. She reports current alcohol use of about 2.0 - 4.0 standard drinks of alcohol per week. She reports that she does not use drugs.  Allergies:  Allergies  Allergen Reactions   Montelukast Other (See Comments)    Migraine reaction    Medications: I have reviewed the patient's current medications.  The PMH, PSH, Medications, Allergies, and SH were reviewed and updated.  ROS: Constitutional: Negative for fever, weight loss and weight gain. Cardiovascular: Negative for chest pain and dyspnea on exertion. Respiratory: Is not experiencing shortness of breath at rest. Gastrointestinal: Negative for nausea and vomiting. Neurological: Negative for headaches. Psychiatric: The patient is not nervous/anxious  Blood pressure 131/83, pulse 85, last menstrual period 04/06/2014, SpO2 94%. There is no height or weight on  file to calculate BMI.  PHYSICAL EXAM:  Exam: General: Well-developed, well-nourished Respiratory Respiratory effort: Equal inspiration and expiration without stridor Cardiovascular Peripheral Vascular: Warm extremities with equal color/perfusion Eyes: No nystagmus with equal extraocular motion bilaterally Neuro/Psych/Balance: Patient oriented to person, place, and time; Appropriate mood and affect; Gait is intact with no imbalance; Cranial nerves I-XII are intact Head and Face Inspection: Normocephalic and atraumatic without mass or lesion Palpation: Facial skeleton intact without bony stepoffs Salivary Glands: No mass or tenderness Facial Strength: Facial motility symmetric and full bilaterally ENT Pinna: External ear intact and fully developed External canal: Canal is patent with intact skin Tympanic Membrane: Clear and mobile External Nose: No scar or anatomic deformity Internal Nose: Septum is moderate. No polyp, or purulence. Mucosal edema and erythema present.  Bilateral inferior turbinate hypertrophy.  Lips, Teeth, and gums: Mucosa and teeth intact and viable TMJ: No pain to palpation with full mobility Oral cavity/oropharynx: No erythema or exudate, no lesions present Nasopharynx: No mass or lesion with intact mucosa Hypopharynx: Intact mucosa without pooling of secretions Larynx Glottic: Full true vocal cord mobility without lesion or mass Supraglottic: Normal appearing epiglottis and AE folds Interarytenoid Space: Moderate pachydermia&edema Subglottic Space: Patent without lesion or edema Neck Neck and Trachea: Midline trachea without mass or lesion Thyroid: No mass or  nodularity Lymphatics: No lymphadenopathy  Procedure: Preoperative diagnosis: chronic throat clearing, globus, GERD  Postoperative diagnosis:   Same  Procedure: Flexible fiberoptic laryngoscopy  Surgeon: Elena Larry, MD  Anesthesia: Topical lidocaine  and Afrin Complications:  None Condition is stable throughout exam  Indications and consent:  The patient presents to the clinic with above symptoms. Indirect laryngoscopy view was incomplete. Thus it was recommended that they undergo a flexible fiberoptic laryngoscopy. All of the risks, benefits, and potential complications were reviewed with the patient preoperatively and verbal informed consent was obtained.  Procedure: The patient was seated upright in the clinic. Topical lidocaine  and Afrin were applied to the nasal cavity. After adequate anesthesia had occurred, I then proceeded to pass the flexible telescope into the nasal cavity. The nasal cavity was patent without rhinorrhea or polyp. The nasopharynx was also patent without mass or lesion. The base of tongue was visualized and was normal. There were no signs of pooling of secretions in the piriform sinuses. The true vocal folds were mobile bilaterally. There were no signs of glottic or supraglottic mucosal lesion or mass. There was moderate interarytenoid pachydermia and post cricoid edema. The telescope was then slowly withdrawn and the patient tolerated the procedure throughout.    Studies Reviewed: CT chest 05/15/23 IMPRESSION: Small area of clustered tree-in-bud nodular densities in the posterior left upper lobe most compatible with small airways disease.   No confluent opacities or effusions.   Coronary artery disease.  Assessment/Plan: Encounter Diagnoses  Name Primary?   Chronic throat clearing Yes   Chronic GERD    Chronic nasal congestion    Environmental and seasonal allergies    Post-nasal drip     Assessment and Plan Assessment & Plan Chronic throat clearing globus and chronic cough  Scope exam is overall unremarkable aside from changes c/w GERD LPR Chronic cough and throat lump sensation likely due to laryngopharyngeal reflux. - continue Pantoprazole - Recommend Reflux Gourmet supplement post-meals to prevent reflux. - Advise lifestyle  changes: elevate head of bed, avoid late meals, limit fried and spicy foods. - Provided reflux management resources, including [employer]. - already referred to GI for potential upper endoscopy   GERD LPR - continue Pantoprazole  -  Reflux Gourmet after meals - diet and lifestyle changes to minimize GERD - Refer to [employer] blog for dietary and lifestyle modifications/reflux cook book  Chronic nasal congestion and post-nasal drainage environmental allergies  Evidence of post-nasal drainage during flexible scope exam today, could be contributing to her sx - trial of Allegra daily and Nasonex - consider nasal saline rinses  - continue allergy  shots  - see Allergy  and Asthma specialist as scheduled       Thank you for allowing me to participate in the care of this patient. Please do not hesitate to contact me with any questions or concerns.   Elena Larry, MD Otolaryngology [facility] Phone: [phone] Fax: [phone]    08/06/2023, 2:26 PM

## 2023-08-06 NOTE — Patient Instructions (Signed)

## 2023-09-18 ENCOUNTER — Ambulatory Visit: Admitting: Dermatology

## 2023-09-18 ENCOUNTER — Encounter: Payer: Self-pay | Admitting: Dermatology

## 2023-09-18 DIAGNOSIS — D2372 Other benign neoplasm of skin of left lower limb, including hip: Secondary | ICD-10-CM

## 2023-09-18 DIAGNOSIS — D239 Other benign neoplasm of skin, unspecified: Secondary | ICD-10-CM

## 2023-09-18 MED ORDER — MUPIROCIN 2 % EX OINT: TOPICAL_OINTMENT | CUTANEOUS | 0 refills | Status: AC

## 2023-09-18 NOTE — Patient Instructions (Signed)
 Wound Care Instructions  On the day following your surgery, you should begin doing daily dressing changes: Remove the old dressing and discard it. Cleanse the wound gently with tap water. This may be done in the shower or by placing a wet gauze pad directly on the wound and letting it soak for several minutes. It is important to gently remove any dried blood from the wound in order to encourage healing. This may be done by gently rolling a moistened Q-tip on the dried blood. Do not pick at the wound. If the wound should start to bleed, continue cleaning the wound, then place a moist gauze pad on the wound and hold pressure for a few minutes.  Make sure you then dry the skin surrounding the wound completely or the tape will not stick to the skin. Do not use cotton balls on the wound. After the wound is clean and dry, apply the ointment gently with a Q-tip. Cut a non-stick pad to fit the size of the wound. Lay the pad flush to the wound. If the wound is draining, you may want to reinforce it with a small amount of gauze on top of the non-stick pad for a little added compression to the area. Use the tape to seal the area completely. Select from the following with respect to your individual situation: If your wound has been stitched closed: continue the above steps 1-8 at least daily until your sutures are removed. If your wound has been left open to heal: continue steps 1-8 at least daily for the first 3-4 weeks. We would like for you to take a few extra precautions for at least the next week. Sleep with your head elevated on pillows if our wound is on your head. Do not bend over or lift heavy items to reduce the chance of elevated blood pressure to the wound Do not participate in particularly strenuous activities.   Below is a list of dressing supplies you might need.  Cotton-tipped applicators - Q-tips Gauze pads (2x2 and/or 4x4) - All-Purpose Sponges Non-stick dressing material - Telfa Tape -  Paper or Hypafix New and clean tube of petroleum jelly - Vaseline    Comments on Post-Operative Period Slight swelling and redness often appear around the wound. This is normal and will disappear within several days following the surgery. The healing wound will drain a brownish-red-yellow discharge during healing. This is a normal phase of wound healing. As the wound begins to heal, the drainage may increase in amount. Again, this drainage is normal. Notify us if the drainage becomes persistently bloody, excessively swollen, or intensely painful or develops a foul odor or red streaks.  If you should experience mild discomfort during the healing phase, you may take an aspirin-free medication such as Tylenol (acetaminophen). Notify us if the discomfort is severe or persistent. Avoid alcoholic beverages when taking pain medicine.  In Case of Wound Hemorrhage A wound hemorrhage is when the bandage suddenly becomes soaked with bright red blood and flows profusely. If this happens, sit down or lie down with your head elevated. If the wound has a dressing on it, do not remove the dressing. Apply pressure to the existing gauze. If the wound is not covered, use a gauze pad to apply pressure and continue applying the pressure for 20 minutes without peeking. DO NOT COVER THE WOUND WITH A LARGE TOWEL OR WASH CLOTH. Release your hand from the wound site but do not remove the dressing. If the bleeding has stopped,  gently clean around the wound. Leave the dressing in place for 24 hours if possible. This wait time allows the blood vessels to close off so that you do not spark a new round of bleeding by disrupting the newly clotted blood vessels with an immediate dressing change. If the bleeding does not subside, continue to hold pressure. If matters are out of your control, contact an After Hours clinic or go to the Emergency Room.

## 2023-09-18 NOTE — Progress Notes (Unsigned)
   Follow-Up Visit   Subjective  Michelle Mccarthy is a 63 y.o. female who presents for the following: Excision of  DYSPLASTIC COMPOUND NEVUS WITH SEVERE ATYPIA, DEEP MARGIN INVOLVED Left thigh near the knee  The following portions of the chart were reviewed this encounter and updated as appropriate: medications, allergies, medical history  Review of Systems:  No other skin or systemic complaints except as noted in HPI or Assessment and Plan.  Objective  Well appearing patient in no apparent distress; mood and affect are within normal limits.  A focused examination was performed of the following areas: Left thigh near the knee Relevant physical exam findings are noted in the Assessment and Plan.   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 Skin excision - left thigh near the knee  Excision method:  elliptical Lesion length (cm):  0.6 Margin per side (cm):  0.4 Total excision diameter (cm):  1.4 Informed consent: discussed and consent obtained   Timeout: patient name, date of birth, surgical site, and procedure verified   Procedure prep:  Patient was prepped and draped in usual sterile fashion Prep type:  Chlorhexidine Anesthesia: the lesion was anesthetized in a standard fashion   Anesthetic:  1% lidocaine  w/ epinephrine 1-100,000 buffered w/ 8.4% NaHCO3 (12 cc) Instrument used: #15 blade   Hemostasis achieved with: suture, pressure and electrodesiccation   Outcome: patient tolerated procedure well with no complications   Additional details:  Tagged Anterior  Skin repair - left thigh near the knee Complexity:  Intermediate Final length (cm):  4.1 Informed consent: discussed and consent obtained   Timeout: patient name, date of birth, surgical site, and procedure verified   Procedure prep:  Patient was prepped and draped in usual sterile fashion Prep type:  Chlorhexidine Anesthesia: the lesion was anesthetized in  a standard fashion   Anesthetic:  1% lidocaine  w/ epinephrine 1-100,000 buffered w/ 8.4% NaHCO3 Reason for type of repair: reduce tension to allow closure, reduce the risk of dehiscence, infection, and necrosis, reduce subcutaneous dead space and avoid a hematoma, allow closure of the large defect and preserve normal anatomy   Undermining: edges could be approximated without difficulty   Subcutaneous layers (deep stitches):  Suture size:  4-0 Suture type: Monocryl (poliglecaprone 25)   Stitches:  Buried vertical mattress Fine/surface layer approximation (top stitches):  Suture size:  5-0 Suture type: Prolene (polypropylene)   Stitches comment:  Running locked Suture removal (days):  7 Hemostasis achieved with: suture, pressure and electrodesiccation Outcome: patient tolerated procedure well with no complications   Post-procedure details: sterile dressing applied and wound care instructions given   Dressing type: petrolatum, bandage and pressure dressing    Specimen 1 - Surgical pathology Differential Diagnosis: R/O  DYSPLASTIC COMPOUND NEVUS WITH SEVERE ATYPIA,   Check Margins: yes [phone] Tagged anterior   Return in about 1 week (around 09/25/2023) for suture removal .  I, Fay Kirks, CMA, am acting as scribe for Boneta Sharps, MD .   Documentation: I have reviewed the above documentation for accuracy and completeness, and I agree with the above.  Boneta Sharps, MD

## 2023-09-20 LAB — SURGICAL PATHOLOGY

## 2023-09-23 ENCOUNTER — Ambulatory Visit: Payer: Self-pay | Admitting: Dermatology

## 2023-09-23 NOTE — Telephone Encounter (Signed)
 Discussed pathology results. Patient voiced understanding. Surgical wound is healing well.

## 2023-09-23 NOTE — Telephone Encounter (Signed)
-----   Message from [place] sent at 09/23/2023  6:53 AM EDT ----- Diagnosis left thigh near the knee :       NO RESIDUAL DYSPLASTIC NEVUS, MARGINS FREE   Please call to share that excision was clear of the abnormal mole and get update on surgical wound. Thank you. ----- Message ----- From: Interface, [facility] Sent: 09/20/2023   4:34 PM EDT To: Boneta Sharps, MD

## 2023-09-25 ENCOUNTER — Ambulatory Visit (INDEPENDENT_AMBULATORY_CARE_PROVIDER_SITE_OTHER): Admitting: Dermatology

## 2023-09-25 DIAGNOSIS — Z5189 Encounter for other specified aftercare: Secondary | ICD-10-CM

## 2023-09-25 DIAGNOSIS — Z48817 Encounter for surgical aftercare following surgery on the skin and subcutaneous tissue: Secondary | ICD-10-CM

## 2023-09-25 DIAGNOSIS — Z4802 Encounter for removal of sutures: Secondary | ICD-10-CM

## 2023-09-25 NOTE — Progress Notes (Signed)
   Follow-Up Visit   Subjective  Michelle Mccarthy is a 62 y.o. female who presents for the following: Suture removal on the left thigh near the knee, patient report she has no problems or concerns today.   Pathology showed NO RESIDUAL DYSPLASTIC NEVUS, MARGINS FREE   The following portions of the chart were reviewed this encounter and updated as appropriate: medications, allergies, medical history  Review of Systems:  No other skin or systemic complaints except as noted in HPI or Assessment and Plan.  Objective  Well appearing patient in no apparent distress; mood and affect are within normal limits.  Areas Examined: left thigh near the knee  Relevant physical exam findings are noted in the Assessment and Plan.    Assessment & Plan   VISIT FOR WOUND CHECK   ENCOUNTER FOR REMOVAL OF SUTURES   Encounter for Removal of Sutures - Incision site is clean, dry and intact. - Wound cleansed, sutures removed, wound cleansed and steri strips applied.  - Discussed pathology results showing NO RESIDUAL DYSPLASTIC NEVUS, MARGINS FREE  - Patient advised to keep steri-strips dry until they fall off. - Scars remodel for a full year. - Once steri-strips fall off, patient can apply over-the-counter silicone scar cream once to twice a day to help with scar remodeling if desired. - Patient advised to call with any concerns or if they notice any new or changing lesions.  Return for scheduled appt 06/30/2024.  IFay Kirks, CMA, am acting as scribe for Boneta Sharps, MD .   Documentation: I have reviewed the above documentation for accuracy and completeness, and I agree with the above.  Boneta Sharps, MD

## 2023-09-25 NOTE — Patient Instructions (Signed)

## 2023-10-02 ENCOUNTER — Encounter: Admitting: Dermatology

## 2023-11-15 ENCOUNTER — Encounter: Admission: RE | Disposition: A | Payer: Self-pay | Source: Home / Self Care | Attending: Gastroenterology

## 2023-11-15 ENCOUNTER — Ambulatory Visit
Admission: RE | Admit: 2023-11-15 | Discharge: 2023-11-15 | Disposition: A | Discharge status: Home / Self Care | Attending: Gastroenterology | Admitting: Gastroenterology

## 2023-11-15 ENCOUNTER — Encounter: Payer: Self-pay | Admitting: Gastroenterology

## 2023-11-15 ENCOUNTER — Ambulatory Visit

## 2023-11-15 ENCOUNTER — Other Ambulatory Visit: Payer: Self-pay

## 2023-11-15 DIAGNOSIS — K224 Dyskinesia of esophagus: Secondary | ICD-10-CM | POA: Insufficient documentation

## 2023-11-15 DIAGNOSIS — K222 Esophageal obstruction: Secondary | ICD-10-CM | POA: Insufficient documentation

## 2023-11-15 DIAGNOSIS — Z800 Family history of malignant neoplasm of digestive organs: Secondary | ICD-10-CM | POA: Insufficient documentation

## 2023-11-15 DIAGNOSIS — R1310 Dysphagia, unspecified: Secondary | ICD-10-CM | POA: Insufficient documentation

## 2023-11-15 DIAGNOSIS — Z79899 Other long term (current) drug therapy: Secondary | ICD-10-CM | POA: Insufficient documentation

## 2023-11-15 DIAGNOSIS — Z860100 Personal history of colon polyps, unspecified: Secondary | ICD-10-CM | POA: Diagnosis not present

## 2023-11-15 DIAGNOSIS — K449 Diaphragmatic hernia without obstruction or gangrene: Secondary | ICD-10-CM | POA: Diagnosis not present

## 2023-11-15 DIAGNOSIS — K219 Gastro-esophageal reflux disease without esophagitis: Secondary | ICD-10-CM | POA: Diagnosis not present

## 2023-11-15 DIAGNOSIS — G43909 Migraine, unspecified, not intractable, without status migrainosus: Secondary | ICD-10-CM | POA: Diagnosis not present

## 2023-11-15 DIAGNOSIS — J45909 Unspecified asthma, uncomplicated: Secondary | ICD-10-CM | POA: Diagnosis not present

## 2023-11-15 HISTORY — PX: ESOPHAGOGASTRODUODENOSCOPY: SHX5428

## 2023-11-15 SURGERY — EGD (ESOPHAGOGASTRODUODENOSCOPY)
Anesthesia: General

## 2023-11-15 MED ORDER — PROPOFOL 500 MG/50ML IV EMUL
INTRAVENOUS | Status: DC | PRN
  Administered 2023-11-15: 50 mg via INTRAVENOUS
  Administered 2023-11-15: 150 ug/kg/min via INTRAVENOUS
  Administered 2023-11-15: 150 mg via INTRAVENOUS

## 2023-11-15 MED ORDER — PROPOFOL 1000 MG/100ML IV EMUL
INTRAVENOUS | Status: AC
  Filled 2023-11-15: qty 100

## 2023-11-15 MED ORDER — SODIUM CHLORIDE 0.9 % IV SOLN: INTRAVENOUS | Status: DC

## 2023-11-15 MED ORDER — LIDOCAINE HCL (PF) 2 % IJ SOLN
INTRAMUSCULAR | Status: DC | PRN
  Administered 2023-11-15: 100 mg via INTRADERMAL

## 2023-11-15 MED ORDER — LIDOCAINE HCL (PF) 2 % IJ SOLN
INTRAMUSCULAR | Status: AC
  Filled 2023-11-15: qty 10

## 2023-11-15 NOTE — Transfer of Care (Signed)
 Immediate Anesthesia Transfer of Care Note  Patient: Michelle Mccarthy  Procedure(s) Performed: EGD (ESOPHAGOGASTRODUODENOSCOPY)  Patient Location: Endoscopy Unit  Anesthesia Type:General  Level of Consciousness: awake  Airway & Oxygen Therapy: Patient Spontanous Breathing  Post-op Assessment: Report given to RN and Post -op Vital signs reviewed and stable  Post vital signs: Reviewed and stable  Last Vitals:  Vitals Value Taken Time  BP 131/88 11/15/23 08:49  Temp    Pulse 96 11/15/23 08:49  Resp 25 11/15/23 08:49  SpO2 100 % 11/15/23 08:49  Vitals shown include unfiled device data.  Last Pain:  Vitals:   11/15/23 0747  TempSrc: Temporal  PainSc: 0-No pain         Complications: There were no known notable events for this encounter.

## 2023-11-15 NOTE — Op Note (Signed)
 [facility] Gastroenterology Patient Name: Michelle Mccarthy Procedure Date: 11/15/2023 8:18 AM MRN: [id number] Account #: [id number] Date of Birth: 24-Oct-1960 Admit Type: Outpatient Age: 63 Room: [facility] ENDO ROOM 1 Gender: Female Note Status: Finalized Instrument Name: [facility]ie GI Scope [id number] Procedure:             Upper GI endoscopy Indications:           Dysphagia Providers:             Elspeth Ozell Jungling DO, DO Referring MD:          Catheryn SAILOR. Jarold, MD (Referring MD) Medicines:             Monitored Anesthesia Care Complications:         No immediate complications. Estimated blood loss:                         Minimal. Procedure:             Pre-Anesthesia Assessment:                        - Prior to the procedure, a History and Physical was                         performed, and patient medications and allergies were                         reviewed. The patient is competent. The risks and                         benefits of the procedure and the sedation options and                         risks were discussed with the patient. All questions                         were answered and informed consent was obtained.                         Patient identification and proposed procedure were                         verified by the physician, the nurse, the anesthetist                         and the technician in the endoscopy suite. Mental                         Status Examination: alert and oriented. Airway                         Examination: normal oropharyngeal airway and neck                         mobility. Respiratory Examination: clear to                         auscultation. CV Examination: RRR, no murmurs, no S3  or S4. Prophylactic Antibiotics: The patient does not                         require prophylactic antibiotics. Prior                         Anticoagulants: The patient has taken no anticoagulant                          or antiplatelet agents. ASA Grade Assessment: II - A                         patient with mild systemic disease. After reviewing                         the risks and benefits, the patient was deemed in                         satisfactory condition to undergo the procedure. The                         anesthesia plan was to use monitored anesthesia care                         (MAC). Immediately prior to administration of                         medications, the patient was re-assessed for adequacy                         to receive sedatives. The heart rate, respiratory                         rate, oxygen saturations, blood pressure, adequacy of                         pulmonary ventilation, and response to care were                         monitored throughout the procedure. The physical                         status of the patient was re-assessed after the                         procedure.                        After obtaining informed consent, the endoscope was                         passed under direct vision. Throughout the procedure,                         the patient's blood pressure, pulse, and oxygen                         saturations were monitored continuously. The Endoscope  was introduced through the mouth, and advanced to the                         second part of duodenum. The upper GI endoscopy was                         accomplished without difficulty. The patient tolerated                         the procedure well. Findings:      The duodenal bulb, first portion of the duodenum and second portion of       the duodenum were normal. Estimated blood loss: none.      A 4 cm hiatal hernia was present. Estimated blood loss: none.      The exam of the stomach was otherwise normal.      Esophagogastric landmarks were identified: the gastroesophageal junction       was found at 36 cm from the incisors.      A moderate Schatzki ring was found at  the gastroesophageal junction. A       TTS dilator was passed through the scope. Dilation with a 15-16.5-18 mm       balloon dilator was performed to 15 mm, 16.5 mm and 18 mm. The dilation       site was examined following endoscope reinsertion and showed mild       mucosal disruption. No change until 18mm TTS balloon used. Small       superficial disruption. Estimated blood loss was minimal.      Normal mucosa was found in the entire esophagus. Biopsies were obtained       from the proximal and distal esophagus with cold forceps for histology       of suspected eosinophilic esophagitis. Estimated blood loss was minimal.      Abnormal motility was noted in the esophagus. The cricopharyngeus was       normal. There is spasticity of the esophageal body. The distal       esophagus/lower esophageal sphincter is open. Estimated blood loss: none.      The exam of the esophagus was otherwise normal. Impression:            - Normal duodenal bulb, first portion of the duodenum                         and second portion of the duodenum.                        - 4 cm hiatal hernia.                        - Esophagogastric landmarks identified.                        - Moderate Schatzki ring. Dilated.                        - Normal mucosa was found in the entire esophagus.                        - Abnormal esophageal motility, suspicious for  esophageal spasm.                        - Biopsies were taken with a cold forceps for                         evaluation of eosinophilic esophagitis. Recommendation:        - Patient has a contact number available for                         emergencies. The signs and symptoms of potential                         delayed complications were discussed with the patient.                         Return to normal activities tomorrow. Written                         discharge instructions were provided to the patient.                        -  Discharge patient to home.                        - Soft diet today.                        - Advance diet as tolerated.                        - Continue present medications.                        - No ibuprofen, naproxen, or other non-steroidal                         anti-inflammatory drugs for 5 days.                        - Await pathology results.                        - Repeat upper endoscopy for surveillance based on                         pathology results.                        - Return to GI office as previously scheduled.                        - The findings and recommendations were discussed with                         the patient. Procedure Code(s):     --- Professional ---                        [phone], Esophagogastroduodenoscopy, flexible,  transoral; with transendoscopic balloon dilation of                         esophagus (less than 30 mm diameter)                        43239, 59, Esophagogastroduodenoscopy, flexible,                         transoral; with biopsy, single or multiple Diagnosis Code(s):     --- Professional ---                        K44.9, Diaphragmatic hernia without obstruction or                         gangrene                        K22.2, Esophageal obstruction                        K22.4, Dyskinesia of esophagus                        R13.10, Dysphagia, unspecified CPT copyright 2022 American Medical Association. All rights reserved. The codes documented in this report are preliminary and upon coder review may  be revised to meet current compliance requirements. Attending Participation:      I personally performed the entire procedure. Elspeth Jungling, DO Elspeth Ozell Jungling DO, DO 11/15/2023 8:52:13 AM This report has been signed electronically. Number of Addenda: 0 Note Initiated On: 11/15/2023 8:18 AM Estimated Blood Loss:  Estimated blood loss was minimal.      [facility]

## 2023-11-15 NOTE — Anesthesia Postprocedure Evaluation (Signed)
 Anesthesia Post Note  Patient: Michelle Mccarthy  Procedure(s) Performed: EGD (ESOPHAGOGASTRODUODENOSCOPY)  Patient location during evaluation: Endoscopy Anesthesia Type: General Level of consciousness: awake and alert Pain management: pain level controlled Vital Signs Assessment: post-procedure vital signs reviewed and stable Respiratory status: spontaneous breathing, nonlabored ventilation and respiratory function stable Cardiovascular status: blood pressure returned to baseline and stable Postop Assessment: no apparent nausea or vomiting Anesthetic complications: no   There were no known notable events for this encounter.   Last Vitals:  Vitals:   11/15/23 0859 11/15/23 0909  BP:    Pulse:    Resp:    Temp:    SpO2: 100% 100%    Last Pain:  Vitals:   11/15/23 0909  TempSrc:   PainSc: 0-No pain                 Fairy POUR [name]

## 2023-11-15 NOTE — Anesthesia Preprocedure Evaluation (Signed)
 Anesthesia Evaluation  Patient identified by MRN, date of birth, ID band Patient awake    Reviewed: Allergy  & Precautions, NPO status , Patient's Chart, lab work & pertinent test results  History of Anesthesia Complications Negative for: history of anesthetic complications  Airway Mallampati: III  TM Distance: <3 FB Neck ROM: full    Dental  (+) Chipped   Pulmonary neg shortness of breath, asthma    Pulmonary exam normal        Cardiovascular Exercise Tolerance: Good (-) angina (-) Past MI negative cardio ROS Normal cardiovascular exam     Neuro/Psych  Headaches  negative psych ROS   GI/Hepatic negative GI ROS, Neg liver ROS,neg GERD  ,,  Endo/Other  negative endocrine ROS    Renal/GU negative Renal ROS  negative genitourinary   Musculoskeletal   Abdominal   Peds  Hematology negative hematology ROS (+)   Anesthesia Other Findings Patient reports that they do not think that any food or pills are stuck in their throat at this time.  Past Medical History: No date: Arthritis     Comment:  back No date: Asthma 11/27/2017: Atypical mole     Comment:  L mid side/mod 12/18/2018: Atypical mole     Comment:  L mid to upper back/mod, L lateral buttock/mod 06/26/2023: Dysplastic nevus     Comment:  left thigh near the knee, severe atypia excised               09/18/2023. No residual DN, margins free. No date: Family history of benign neoplasm of colon No date: Family history of ovarian cancer     Comment:  9/20 genetic testing letter sent 09/18/2016: Hx of basal cell carcinoma     Comment:  multiple sites  06/11/2017: Hx of dysplastic nevus     Comment:  L ant waistline lateral/excision No date: Hx of migraines No date: Insomnia No date: Menopausal and female climacteric states No date: Migraine headache     Comment:  2-3/week No date: Motion sickness     Comment:  cars - back seat, circular motion No date:  Postmenopausal atrophic vaginitis  Past Surgical History: 2016: BACK SURGERY     Comment:  Microdiskectomy 09/25/2019: COLONOSCOPY WITH PROPOFOL ; N/A     Comment:  Procedure: COLONOSCOPY WITH PROPOFOL ;  Surgeon: Jinny Carmine, MD;  Location: [facility];  Service:               Endoscopy;  Laterality: N/A;  priority 4 04/20/2020: LUMBAR FUSION     Comment:  Dr. Melodie 09/25/2019: POLYPECTOMY     Comment:  Procedure: POLYPECTOMY;  Surgeon: Jinny Carmine, MD;                Location: [facility];  Service: Endoscopy;;     Reproductive/Obstetrics negative OB ROS                              Anesthesia Physical Anesthesia Plan  ASA: 2  Anesthesia Plan: General   Post-op Pain Management:    Induction: Intravenous  PONV Risk Score and Plan: Propofol  infusion and TIVA  Airway Management Planned: Natural Airway and Nasal Cannula  Additional Equipment:   Intra-op Plan:   Post-operative Plan:   Informed Consent: I have reviewed the patients History and Physical, chart, labs and discussed the procedure including the risks, benefits and  alternatives for the proposed anesthesia with the patient or authorized representative who has indicated his/her understanding and acceptance.     Dental Advisory Given  Plan Discussed with: Anesthesiologist, CRNA and Surgeon  Anesthesia Plan Comments: (Patient consented for risks of anesthesia including but not limited to:  - adverse reactions to medications - risk of airway placement if required - damage to eyes, teeth, lips or other oral mucosa - nerve damage due to positioning  - sore throat or hoarseness - Damage to heart, brain, nerves, lungs, other parts of body or loss of life  Patient voiced understanding and assent.)        Anesthesia Quick Evaluation

## 2023-11-15 NOTE — H&P (Signed)
 Pre-Procedure H&P   Patient ID: Michelle Mccarthy is a 63 y.o. female.  Gastroenterology Provider: Elspeth Ozell Jungling, DO  Referring Provider: Romero [place], PA PCP: Jarold Medici, MD  Date: 11/15/2023  HPI Michelle Mccarthy is a 63 y.o. female who presents today for Esophagogastroduodenoscopy for Dysphagia, GERD .  Chronic reflux worsening after prednisone  use in February.  This is stable on rabeprazole.  She notes chronic throat clearing.  Has had solid food dysphagia requiring her to take small bites to compensate.  No issues with liquids or pills.  No odynophagia.  No lower GI symptoms  Father with history of colon cancer in his 94s  Colonoscopy in September 2021 with 2 adenomatous polyps  Hemoglobin 13.7 MCV 95.8 platelets 362,000   Past Medical History:  Diagnosis Date   Arthritis    back   Asthma    Atypical mole 11/27/2017   L mid side/mod   Atypical mole 12/18/2018   L mid to upper back/mod, L lateral buttock/mod   Dysplastic nevus 06/26/2023   left thigh near the knee, severe atypia excised 09/18/2023. No residual DN, margins free.   Family history of benign neoplasm of colon    Family history of ovarian cancer    9/20 genetic testing letter sent   Hx of basal cell carcinoma 09/18/2016   multiple sites    Hx of dysplastic nevus 06/11/2017   L ant waistline lateral/excision   Hx of migraines    Insomnia    Menopausal and female climacteric states    Migraine headache    2-3/week   Motion sickness    cars - back seat, circular motion   Postmenopausal atrophic vaginitis     Past Surgical History:  Procedure Laterality Date   BACK SURGERY  2016   Microdiskectomy   COLONOSCOPY WITH PROPOFOL  N/A 09/25/2019   Procedure: COLONOSCOPY WITH PROPOFOL ;  Surgeon: Jinny Carmine, MD;  Location: [facility];  Service: Endoscopy;  Laterality: N/A;  priority 4   LUMBAR FUSION  04/20/2020   Dr. Melodie   POLYPECTOMY  09/25/2019   Procedure: POLYPECTOMY;   Surgeon: Jinny Carmine, MD;  Location: [facility];  Service: Endoscopy;;    Family History Father - crc 65s No h/o GI disease or malignancy  Review of Systems  Constitutional:  Negative for activity change, appetite change, chills, diaphoresis, fatigue, fever and unexpected weight change.  HENT:  Positive for trouble swallowing. Negative for voice change.   Respiratory:  Positive for cough. Negative for shortness of breath and wheezing.   Cardiovascular:  Negative for chest pain, palpitations and leg swelling.  Gastrointestinal:  Negative for abdominal distention, abdominal pain, anal bleeding, blood in stool, constipation, diarrhea, nausea, rectal pain and vomiting.  Musculoskeletal:  Negative for arthralgias and myalgias.  Skin:  Negative for color change and pallor.  Neurological:  Negative for dizziness, syncope and weakness.  Psychiatric/Behavioral:  Negative for confusion.   All other systems reviewed and are negative.    Medications No current facility-administered medications on file prior to encounter.   Current Outpatient Medications on File Prior to Encounter  Medication Sig Dispense Refill   b complex vitamins capsule Take 1 capsule by mouth daily.     BOTOX 100 units SOLR injection every 3 (three) months. For migraines     Cholecalciferol (VITAMIN D3) 5000 units TABS Take 1 capsule by mouth once. 2,500 units daily     Galcanezumab-gnlm 120 MG/ML SOAJ Inject into the skin. One injection monthly  imipramine (TOFRANIL) 25 MG tablet Take 25 mg by mouth daily.     levocetirizine (XYZAL) 5 MG tablet Take 5 mg by mouth every evening.     Multiple Vitamin (MULTIVITAMIN) tablet Take 1 tablet by mouth daily.     omalizumab (XOLAIR) 150 MG/ML pen 1 injection Subcutaneous every 14 days; Duration: 28 days     omeprazole  (PRILOSEC  OTC) 20 MG tablet Take 1 tablet (20 mg total) by mouth daily.     POTASSIUM CHLORIDE PO Take 1 tablet by mouth once. 550 mg daily     TURMERIC  CURCUMIN PO Take by mouth daily.     vitamin E 1000 UNIT capsule Take 400 Units by mouth daily.      albuterol (PROVENTIL HFA;VENTOLIN HFA) 108 (90 Base) MCG/ACT inhaler      azelastine (ASTELIN) 0.1 % nasal spray USE 1 SPRAY IN EACH NOSTRIL TWICE A DAY NASALLY 30 DAY  5   doxycycline  (PERIOSTAT ) 20 MG tablet TAKE 1 TABLET (20 MG TOTAL) BY MOUTH 2 (TWO) TIMES DAILY. TAKE WITH FOOD AND DRINK. FOR ROSACEA FLARES (Patient not taking: Reported on 11/15/2023) 180 tablet 1   eletriptan (RELPAX) 40 MG tablet Take 40 mg by mouth as needed.     EPINEPHrine 0.3 mg/0.3 mL IJ SOAJ injection AS DIRECTED INJECTION 30     hydrocortisone  2.5 % cream Apply topically 2 (two) times daily as needed (Rash). Qd to bid aa rash under breast until clear, then prn flares 30 g 11   Ivermectin  1 % CREA Apply 1 Application topically at bedtime. 30 g 11   mupirocin  ointment (BACTROBAN ) 2 % Apply to skin qd-bid 22 g 0   Ruxolitinib Phosphate  (OPZELURA ) 1.5 % CREA Apply 1 Application topically daily. Prn flares (Patient not taking: Reported on 08/06/2023) 60 g 6   triamcinolone  cream (KENALOG ) 0.1 % Apply to rash BID PRN. Avoid applying to face, groin, and axilla. Use as directed. Long-term use can cause thinning of the skin. 45 g 0   valACYclovir  (VALTREX ) 500 MG tablet TAKE 1 TABLET (500 MG TOTAL) BY MOUTH 2 (TWO) TIMES DAILY FOR 7 DAYS. AS NEEDED FOR FLARES 180 tablet 1    Pertinent medications related to GI and procedure were reviewed by me with the patient prior to the procedure   Current Facility-Administered Medications:    0.9 %  sodium chloride infusion, , Intravenous, Continuous, Onita Elspeth Sharper, DO, Last Rate: 20 mL/hr at 11/15/23 0827, Continued from Pre-op at 11/15/23 0827  sodium chloride 20 mL/hr at 11/15/23 0827       Allergies  Allergen Reactions   Montelukast Other (See Comments)    Migraine reaction   Allergies were reviewed by me prior to the procedure  Objective   Body mass index is 24.9  kg/m. Vitals:   11/15/23 0747  BP: 121/83  Pulse: 81  Resp: 18  Temp: (!) 96.4 F (35.8 C)  TempSrc: Temporal  SpO2: 100%  Weight: 76.5 kg  Height: 5' 9 (1.753 m)     Physical Exam Vitals and nursing note reviewed.  Constitutional:      General: She is not in acute distress.    Appearance: Normal appearance. She is not ill-appearing, toxic-appearing or diaphoretic.  HENT:     Head: Normocephalic and atraumatic.     Nose: Nose normal.     Mouth/Throat:     Mouth: Mucous membranes are moist.     Pharynx: Oropharynx is clear.  Eyes:     General: No  scleral icterus.    Extraocular Movements: Extraocular movements intact.  Cardiovascular:     Rate and Rhythm: Normal rate and regular rhythm.     Heart sounds: Normal heart sounds. No murmur heard.    No friction rub. No gallop.  Pulmonary:     Effort: Pulmonary effort is normal. No respiratory distress.     Breath sounds: Normal breath sounds. No wheezing, rhonchi or rales.  Abdominal:     General: Bowel sounds are normal. There is no distension.     Palpations: Abdomen is soft.     Tenderness: There is no abdominal tenderness. There is no guarding or rebound.  Musculoskeletal:     Cervical back: Neck supple.     Right lower leg: No edema.     Left lower leg: No edema.  Skin:    General: Skin is warm and dry.     Coloration: Skin is not jaundiced or pale.  Neurological:     General: No focal deficit present.     Mental Status: She is alert and oriented to person, place, and time. Mental status is at baseline.  Psychiatric:        Mood and Affect: Mood normal.        Behavior: Behavior normal.        Thought Content: Thought content normal.        Judgment: Judgment normal.      Assessment:  Michelle Mccarthy is a 63 y.o. female  who presents today for Esophagogastroduodenoscopy for Dysphagia, GERD .  Plan:  Esophagogastroduodenoscopy with possible intervention today  Esophagogastroduodenoscopy with  possible biopsy, control of bleeding, polypectomy, and interventions as necessary has been discussed with the patient/patient representative. Informed consent was obtained from the patient/patient representative after explaining the indication, nature, and risks of the procedure including but not limited to death, bleeding, perforation, missed neoplasm/lesions, cardiorespiratory compromise, and reaction to medications. Opportunity for questions was given and appropriate answers were provided. Patient/patient representative has verbalized understanding is amenable to undergoing the procedure.   Elspeth Ozell Jungling, DO  [facility]  Portions of the record may have been created with voice recognition software. Occasional wrong-word or 'sound-a-like' substitutions may have occurred due to the inherent limitations of voice recognition software.  Read the chart carefully and recognize, using context, where substitutions may have occurred.

## 2023-11-15 NOTE — Interval H&P Note (Signed)
 History and Physical Interval Note: Preprocedure H&P from 11/15/23  was reviewed and there was no interval change after seeing and examining the patient.  Written consent was obtained from the patient after discussion of risks, benefits, and alternatives. Patient has consented to proceed with Esophagogastroduodenoscopy with possible intervention   11/15/2023 8:30 AM  Michelle Mccarthy  has presented today for surgery, with the diagnosis of Dysphagia, unspecified type [R13.10]  Gastroesophageal reflux disease, unspecified whether esophagitis present [K21.9].  The various methods of treatment have been discussed with the patient and family. After consideration of risks, benefits and other options for treatment, the patient has consented to  Procedure(s): EGD (ESOPHAGOGASTRODUODENOSCOPY) (N/A) as a surgical intervention.  The patient's history has been reviewed, patient examined, no change in status, stable for surgery.  I have reviewed the patient's chart and labs.  Questions were answered to the patient's satisfaction.     Elspeth Ozell Jungling

## 2023-11-15 NOTE — Addendum Note (Signed)
 Addendum  created 11/15/23 0952 by Tamrah Victorino R, CRNA   Flowsheet accepted

## 2023-11-18 LAB — SURGICAL PATHOLOGY

## 2024-03-24 ENCOUNTER — Encounter: Payer: Self-pay | Admitting: Internal Medicine

## 2024-06-30 ENCOUNTER — Ambulatory Visit: Admitting: Dermatology
